# Patient Record
Sex: Male | Born: 1960 | Race: White | Hispanic: No | Smoking: Current every day smoker
Health system: Southern US, Community
[De-identification: ages and names within clinical notes are randomized; demographics above are authoritative.]

## PROBLEM LIST (undated history)

## (undated) DIAGNOSIS — I10 Essential (primary) hypertension: Secondary | ICD-10-CM

## (undated) DIAGNOSIS — R51 Headache: Secondary | ICD-10-CM

## (undated) DIAGNOSIS — M1990 Unspecified osteoarthritis, unspecified site: Secondary | ICD-10-CM

## (undated) DIAGNOSIS — Q288 Other specified congenital malformations of circulatory system: Secondary | ICD-10-CM

## (undated) DIAGNOSIS — E785 Hyperlipidemia, unspecified: Secondary | ICD-10-CM

## (undated) DIAGNOSIS — F419 Anxiety disorder, unspecified: Secondary | ICD-10-CM

## (undated) DIAGNOSIS — R519 Headache, unspecified: Secondary | ICD-10-CM

## (undated) HISTORY — PX: EYE SURGERY: SHX253

## (undated) HISTORY — PX: OTHER SURGICAL HISTORY: SHX169

## (undated) HISTORY — DX: Essential (primary) hypertension: I10

---

## 2008-02-16 ENCOUNTER — Encounter: Payer: Self-pay | Admitting: Family Medicine

## 2008-05-20 ENCOUNTER — Ambulatory Visit: Payer: Self-pay | Admitting: Family Medicine

## 2008-05-20 DIAGNOSIS — I10 Essential (primary) hypertension: Secondary | ICD-10-CM

## 2009-02-03 ENCOUNTER — Ambulatory Visit: Payer: Self-pay | Admitting: Family Medicine

## 2009-02-07 ENCOUNTER — Telehealth: Payer: Self-pay | Admitting: Family Medicine

## 2009-02-27 ENCOUNTER — Encounter: Payer: Self-pay | Admitting: Family Medicine

## 2009-02-27 LAB — CONVERTED CEMR LAB
BUN: 17 mg/dL (ref 6–23)
CO2: 22 meq/L (ref 19–32)
Chloride: 98 meq/L (ref 96–112)
Glucose, Bld: 118 mg/dL — ABNORMAL HIGH (ref 70–99)
Potassium: 4.1 meq/L (ref 3.5–5.3)
Sodium: 135 meq/L (ref 135–145)

## 2009-03-11 ENCOUNTER — Telehealth (INDEPENDENT_AMBULATORY_CARE_PROVIDER_SITE_OTHER): Payer: Self-pay | Admitting: Internal Medicine

## 2009-03-17 ENCOUNTER — Telehealth: Payer: Self-pay | Admitting: Family Medicine

## 2009-03-18 ENCOUNTER — Ambulatory Visit: Payer: Self-pay | Admitting: Family Medicine

## 2009-03-18 DIAGNOSIS — M545 Low back pain: Secondary | ICD-10-CM

## 2009-11-04 ENCOUNTER — Telehealth (INDEPENDENT_AMBULATORY_CARE_PROVIDER_SITE_OTHER): Payer: Self-pay | Admitting: *Deleted

## 2009-11-05 ENCOUNTER — Ambulatory Visit: Payer: Self-pay | Admitting: Family Medicine

## 2010-11-03 ENCOUNTER — Ambulatory Visit: Payer: Self-pay | Admitting: Family Medicine

## 2010-11-03 LAB — CONVERTED CEMR LAB
BUN: 12 mg/dL (ref 6–23)
CO2: 29 meq/L (ref 19–32)
Calcium: 10.2 mg/dL (ref 8.4–10.5)
Creatinine, Ser: 0.9 mg/dL (ref 0.40–1.50)
Glucose, Bld: 95 mg/dL (ref 70–99)
Sodium: 133 meq/L — ABNORMAL LOW (ref 135–145)

## 2011-01-12 NOTE — Assessment & Plan Note (Signed)
Summary: f/u BP   Vital Signs:  Patient profile:   50 year old male Height:      64.2 inches Weight:      145 pounds Pulse rate:   95 / minute BP sitting:   130 / 93  (right arm) Cuff size:   regular  Vitals Entered By: Avon Gully CMA, Duncan Dull) (November 03, 2010 9:19 AM) CC: f/u BP out of meds, Hypertension Management   Primary Care Provider:  Nani Gasser MD  CC:  f/u BP out of meds and Hypertension Management.  History of Present Illness: f/u BP out of meds  Hypertension History:      He denies headache, chest pain, palpitations, dyspnea with exertion, orthopnea, PND, peripheral edema, visual symptoms, neurologic problems, syncope, and side effects from treatment.  He notes no problems with any antihypertensive medication side effects.        Positive major cardiovascular risk factors include male age 36 years old or older, hypertension, and current tobacco user.  Negative major cardiovascular risk factors include negative family history for ischemic heart disease.     Current Medications (verified): 1)  Lisinopril 10 Mg Tabs (Lisinopril) .... Take 1 Tablet By Mouth Once A Day 2)  Hydrochlorothiazide 25 Mg Tabs (Hydrochlorothiazide) .... Take 1 Tablet By Mouth Once A Day  Allergies (verified): No Known Drug Allergies  Comments:  Nurse/Medical Assistant: The patient's medications and allergies were reviewed with the patient and were updated in the Medication and Allergy Lists. 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 GED.  Married to Eustace Hur with a 64 yr old daughter.  Current Smoker Alcohol use-yes Drug use-no Regular exercise-no  Physical Exam  General:  Well-developed,well-nourished,in no acute distress; alert,appropriate and cooperative throughout examination Head:  Normocephalic and atraumatic without obvious  abnormalities. No apparent alopecia or balding. Lungs:  Normal respiratory effort, chest expands symmetrically. Lungs are clear to auscultation, no crackles or wheezes. Heart:  Normal rate and regular rhythm. S1 and S2 normal without gallop, murmur, click, rub or other extra sounds. Skin:  no rashes.   Psych:  Cognition and judgment appear intact. Alert and cooperative with normal attention span and concentration. No apparent delusions, illusions, hallucinations   Impression & Recommendations:  Problem # 1:  HYPERTENSION, BENIGN (ICD-401.1) Restart meds.  His updated medication list for this problem includes:    Lisinopril 10 Mg Tabs (Lisinopril) .Marland Kitchen... Take 1 tablet by mouth once a day    Hydrochlorothiazide 25 Mg Tabs (Hydrochlorothiazide) .Marland Kitchen... Take 1 tablet by mouth once a day  Orders: T-Basic Metabolic Panel [id number])  BP today: 130/93 Prior BP: 130/86 (11/05/2009)  Prior 10 Yr Risk Heart Disease: Not enough information (02/03/2009)  Labs Reviewed: K+: 4.1 (02/27/2009) Creat: : 0.87 (02/27/2009)     Complete Medication List: 1)  Lisinopril 10 Mg Tabs (Lisinopril) .... Take 1 tablet by mouth once a day 2)  Hydrochlorothiazide 25 Mg Tabs (Hydrochlorothiazide) .... Take 1 tablet by mouth once a day  Hypertension Assessment/Plan:      The patient's hypertensive risk group is category B: At least one risk factor (excluding diabetes) with no target organ damage.  Today's blood pressure is 130/93.  His blood pressure goal is < 140/90.  Patient Instructions: 1)  Follow up in one year for blood pressure.  2)  Try to get your cholesterol checked sometimes this year.  Prescriptions: HYDROCHLOROTHIAZIDE 25 MG TABS (HYDROCHLOROTHIAZIDE) Take 1 tablet by mouth once a day  #90 x 3   Entered and Authorized by:   Nani Gasser MD   Signed by:   Nani Gasser MD on 11/03/2010   Method used:   Electronically to        [facility] [phone]* (retail)       [address]       [place], [place]  [address]       Ph: [phone]       Fax: [phone]   RxID:   [id number] LISINOPRIL 10 MG TABS (LISINOPRIL) Take 1 tablet by mouth once a day  #90 x 3   Entered and Authorized by:   Nani Gasser MD   Signed by:   Nani Gasser MD on 11/03/2010   Method used:   Electronically to        [facility] [phone]* (retail)       [address]       [place], [place]  [address]       Ph: [phone]       Fax: [phone]   RxID:   [id number]    Orders Added: 1)  T-Basic Metabolic Panel [[id number]] 2)  Est. Patient Level II [35573]

## 2011-04-26 ENCOUNTER — Ambulatory Visit (INDEPENDENT_AMBULATORY_CARE_PROVIDER_SITE_OTHER): Payer: Self-pay | Admitting: Family Medicine

## 2011-04-26 DIAGNOSIS — IMO0002 Reserved for concepts with insufficient information to code with codable children: Secondary | ICD-10-CM

## 2011-04-26 MED ORDER — SULFAMETHOXAZOLE-TMP DS 800-160 MG PO TABS: 1.0000 | ORAL_TABLET | Freq: Two times a day (BID) | ORAL | Status: AC

## 2011-04-26 NOTE — Patient Instructions (Addendum)
Call if not completely in 10 days.

## 2011-04-26 NOTE — Progress Notes (Signed)
  Subjective:    Patient ID: Gerald Rogers, male    DOB: Dec 06, 1961, 50 y.o.   MRN: [id number]  HPI On the right hand his third digit he has some erythema around the base of the nail. He said that initially he took some amoxicillin that he had to try get better and it actually did improve. Now is starting to get tender and sore again. He has not seen any drainage or pus. He said he did notice some blood one day. He says he accidentally dictating it and it's very tender. No fever. No joint pain.   Review of Systems     Objective:   Physical Exam  Skin:       Digit on the right hand with some mild erythema at the base of the nail. No sign of actual abscess or drainage. He is tender to touch.          Assessment & Plan:   paronychia-discussed treatment options. At this point he certainly has inflammation but I do not think he would actually have any drainage from it. I decided to go ahead and put him on an antibiotic that will cover MRSA. It is not a significant improvement in 5 days please return and we will see if he would benefit from incision and drainage.

## 2011-11-01 ENCOUNTER — Other Ambulatory Visit: Payer: Self-pay | Admitting: *Deleted

## 2011-11-01 MED ORDER — HYDROCHLOROTHIAZIDE 25 MG PO TABS: 25.0000 mg | ORAL_TABLET | Freq: Every day | ORAL | Status: DC

## 2011-11-01 MED ORDER — LISINOPRIL 10 MG PO TABS: 10.0000 mg | ORAL_TABLET | Freq: Every day | ORAL | Status: DC

## 2011-11-12 ENCOUNTER — Encounter: Payer: Self-pay | Admitting: Family Medicine

## 2011-11-18 ENCOUNTER — Ambulatory Visit
Admission: RE | Admit: 2011-11-18 | Discharge: 2011-11-18 | Disposition: A | Discharge status: Home / Self Care | Payer: Self-pay | Source: Ambulatory Visit | Attending: Family Medicine | Admitting: Family Medicine

## 2011-11-18 ENCOUNTER — Encounter: Payer: Self-pay | Admitting: Family Medicine

## 2011-11-18 ENCOUNTER — Ambulatory Visit (INDEPENDENT_AMBULATORY_CARE_PROVIDER_SITE_OTHER): Payer: Self-pay | Admitting: Family Medicine

## 2011-11-18 DIAGNOSIS — R079 Chest pain, unspecified: Secondary | ICD-10-CM

## 2011-11-18 MED ORDER — LISINOPRIL 10 MG PO TABS: 10.0000 mg | ORAL_TABLET | Freq: Every day | ORAL | Status: DC

## 2011-11-18 MED ORDER — HYDROCHLOROTHIAZIDE 25 MG PO TABS: 25.0000 mg | ORAL_TABLET | Freq: Every day | ORAL | Status: DC
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
## 2011-11-18 NOTE — Patient Instructions (Signed)
Recommend daily ASA 81 mg daily.   Keep taking blood pressure medication.  We will call you with your lab and xray results. If you don't here from [place] in about a week then please give [place] a call at [phone].

## 2011-11-19 ENCOUNTER — Encounter: Payer: Self-pay | Admitting: Family Medicine

## 2011-11-19 LAB — LIPID PANEL
HDL: 35 mg/dL — ABNORMAL LOW (ref >39)
LDL Cholesterol: 111 mg/dL — ABNORMAL HIGH (ref 0–99)
Total CHOL/HDL Ratio: 4.9 Ratio
Triglycerides: 136 mg/dL (ref <150)

## 2011-11-19 LAB — COMPLETE METABOLIC PANEL WITH GFR
ALT: 15 U/L (ref 0–53)
Alkaline Phosphatase: 63 U/L (ref 39–117)
CO2: 30 mEq/L (ref 19–32)
Creat: 0.81 mg/dL (ref 0.50–1.35)
GFR, Est African American: >89 mL/min
Sodium: 133 mEq/L — ABNORMAL LOW (ref 135–145)
Total Bilirubin: 0.4 mg/dL (ref 0.3–1.2)
Total Protein: 7.1 g/dL (ref 6.0–8.3)

## 2011-11-19 LAB — TROPONIN I: Troponin I: <0.01 ng/mL (ref <0.06)

## 2011-11-26 ENCOUNTER — Telehealth: Payer: Self-pay | Admitting: Family Medicine

## 2011-11-26 NOTE — Telephone Encounter (Signed)
Patient request to get a call back about meds he has been taking Lisinopril. He thinks he may need a medicine change

## 2011-11-26 NOTE — Telephone Encounter (Signed)
Called and no answer left message to call back

## 2011-12-02 ENCOUNTER — Telehealth: Payer: Self-pay | Admitting: *Deleted

## 2011-12-02 MED ORDER — METOPROLOL SUCCINATE ER 25 MG PO TB24: 25.0000 mg | ORAL_TABLET | Freq: Every day | ORAL | Status: DC

## 2011-12-02 NOTE — Telephone Encounter (Signed)
New prescription sent to pharmacy. Continue HCTZ but stop the lisinopril and start a new medication called metoprolol. Make sure he has a followup appointment.

## 2011-12-02 NOTE — Telephone Encounter (Signed)
Pt called and states he feels his chest pain may be coming from the Lisinopril and wants to know if there are any other meds that can be rx'ed. Also wants to know what he can take for pain since he cannot take advil

## 2011-12-03 NOTE — Telephone Encounter (Signed)
Left message on pt.'s vm.

## 2012-02-04 ENCOUNTER — Other Ambulatory Visit: Payer: Self-pay | Admitting: *Deleted

## 2012-02-04 MED ORDER — METOPROLOL SUCCINATE ER 25 MG PO TB24: 25.0000 mg | ORAL_TABLET | Freq: Every day | ORAL | Status: DC

## 2012-02-04 MED ORDER — HYDROCHLOROTHIAZIDE 25 MG PO TABS: 25.0000 mg | ORAL_TABLET | Freq: Every day | ORAL | Status: DC

## 2012-02-21 ENCOUNTER — Other Ambulatory Visit: Payer: Self-pay | Admitting: *Deleted

## 2012-02-21 MED ORDER — METOPROLOL SUCCINATE ER 25 MG PO TB24: 25.0000 mg | ORAL_TABLET | Freq: Every day | ORAL | Status: DC

## 2012-06-01 ENCOUNTER — Encounter: Payer: Self-pay | Admitting: *Deleted

## 2012-06-09 ENCOUNTER — Ambulatory Visit (INDEPENDENT_AMBULATORY_CARE_PROVIDER_SITE_OTHER): Payer: Self-pay | Admitting: Physician Assistant

## 2012-06-09 ENCOUNTER — Encounter: Payer: Self-pay | Admitting: Physician Assistant

## 2012-06-09 VITALS — BP 115/79 | HR 83 | Ht 64.0 in | Wt 131.0 lb

## 2012-06-09 DIAGNOSIS — Z720 Tobacco use: Secondary | ICD-10-CM

## 2012-06-09 DIAGNOSIS — F17200 Nicotine dependence, unspecified, uncomplicated: Secondary | ICD-10-CM

## 2012-06-09 DIAGNOSIS — IMO0001 Reserved for inherently not codable concepts without codable children: Secondary | ICD-10-CM

## 2012-06-09 DIAGNOSIS — I10 Essential (primary) hypertension: Secondary | ICD-10-CM

## 2012-06-09 DIAGNOSIS — Z79899 Other long term (current) drug therapy: Secondary | ICD-10-CM

## 2012-06-09 MED ORDER — BUPROPION HCL ER (SR) 150 MG PO TB12: 150.0000 mg | ORAL_TABLET | Freq: Two times a day (BID) | ORAL | Status: DC

## 2012-06-09 MED ORDER — HYDROCHLOROTHIAZIDE 25 MG PO TABS: 25.0000 mg | ORAL_TABLET | Freq: Every day | ORAL | Status: DC

## 2012-06-09 MED ORDER — METOPROLOL SUCCINATE ER 25 MG PO TB24: 25.0000 mg | ORAL_TABLET | Freq: Every day | ORAL | Status: DC

## 2012-06-09 NOTE — Patient Instructions (Addendum)
Refilled blood pressure medications. Gave Wellbutrin 150mg  tab twice a day to help stop smoking. Set quit date 1 week after starting pills. Follow up in 6 months.  Will give stool cards to bring back to office.  Smoking Cessation This document explains the best ways for you to quit smoking and new treatments to help. It lists new medicines that can double or triple your chances of quitting and quitting for good. It also considers ways to avoid relapses and concerns you may have about quitting, including weight gain. NICOTINE: A POWERFUL ADDICTION If you have tried to quit smoking, you know how hard it can be. It is hard because nicotine is a very addictive drug. For some people, it can be as addictive as heroin or cocaine. Usually, people make 2 or 3 tries, or more, before finally being able to quit. Each time you try to quit, you can learn about what helps and what hurts. Quitting takes hard work and a lot of effort, but you can quit smoking. QUITTING SMOKING IS ONE OF THE MOST IMPORTANT THINGS YOU WILL EVER DO.  You will live longer, feel better, and live better.   The impact on your body of quitting smoking is felt almost immediately:   Within 20 minutes, blood pressure decreases. Pulse returns to its normal level.   After 8 hours, carbon monoxide levels in the blood return to normal. Oxygen level increases.   After 24 hours, chance of heart attack starts to decrease. Breath, hair, and body stop smelling like smoke.   After 48 hours, damaged nerve endings begin to recover. Sense of taste and smell improve.   After 72 hours, the body is virtually free of nicotine. Bronchial tubes relax and breathing becomes easier.   After 2 to 12 weeks, lungs can hold more air. Exercise becomes easier and circulation improves.   Quitting will reduce your risk of having a heart attack, stroke, cancer, or lung disease:   After 1 year, the risk of coronary heart disease is cut in half.   After 5 years,  the risk of stroke falls to the same as a nonsmoker.   After 10 years, the risk of lung cancer is cut in half and the risk of other cancers decreases significantly.   After 15 years, the risk of coronary heart disease drops, usually to the level of a nonsmoker.   If you are pregnant, quitting smoking will improve your chances of having a healthy baby.   The people you live with, especially your children, will be healthier.   You will have extra money to spend on things other than cigarettes.  FIVE KEYS TO QUITTING Studies have shown that these 5 steps will help you quit smoking and quit for good. You have the best chances of quitting if you use them together: 1. Get ready.  2. Get support and encouragement.  3. Learn new skills and behaviors.  4. Get medicine to reduce your nicotine addiction and use it correctly.  5. Be prepared for relapse or difficult situations. Be determined to continue trying to quit, even if you do not succeed at first.  1. GET READY  Set a quit date.   Change your environment.   Get rid of ALL cigarettes, ashtrays, matches, and lighters in your home, car, and place of work.   Do not let people smoke in your home.   Review your past attempts to quit. Think about what worked and what did not.   Once you  quit, do not smoke. NOT EVEN A PUFF!  2. GET SUPPORT AND ENCOURAGEMENT Studies have shown that you have a better chance of being successful if you have help. You can get support in many ways.  Tell your family, friends, and coworkers that you are going to quit and need their support. Ask them not to smoke around you.   Talk to your caregivers (doctor, dentist, nurse, pharmacist, psychologist, and/or smoking counselor).   Get individual, group, or telephone counseling and support. The more counseling you have, the better your chances are of quitting. Programs are available at [employer] and health centers. Call your local health department for  information about programs in your area.   Spiritual beliefs and practices may help some smokers quit.   Quit meters are Photographer that keep track of quit statistics, such as amount of "quit-time," cigarettes not smoked, and money saved.   Many smokers find one or more of the many self-help books available useful in helping them quit and stay off tobacco.  3. LEARN NEW SKILLS AND BEHAVIORS  Try to distract yourself from urges to smoke. Talk to someone, go for a walk, or occupy your time with a task.   When you first try to quit, change your routine. Take a different route to work. Drink tea instead of coffee. Eat breakfast in a different place.   Do something to reduce your stress. Take a hot bath, exercise, or read a book.   Plan something enjoyable to do every day. Reward yourself for not smoking.   Explore interactive web-based programs that specialize in helping you quit.  4. GET MEDICINE AND USE IT CORRECTLY Medicines can help you stop smoking and decrease the urge to smoke. Combining medicine with the above behavioral methods and support can quadruple your chances of successfully quitting smoking. The U.S. Food and Drug Administration (FDA) has approved 7 medicines to help you quit smoking. These medicines fall into 3 categories.  Nicotine replacement therapy (delivers nicotine to your body without the negative effects and risks of smoking):   Nicotine gum: Available over-the-counter.   Nicotine lozenges: Available over-the-counter.   Nicotine inhaler: Available by prescription.   Nicotine nasal spray: Available by prescription.   Nicotine skin patches (transdermal): Available by prescription and over-the-counter.   Antidepressant medicine (helps people abstain from smoking, but how this works is unknown):   Bupropion sustained-release (SR) tablets: Available by prescription.   Nicotinic receptor partial agonist (simulates the effect of  nicotine in your brain):   Varenicline tartrate tablets: Available by prescription.   Ask your caregiver for advice about which medicines to use and how to use them. Carefully read the information on the package.   Everyone who is trying to quit may benefit from using a medicine. If you are pregnant or trying to become pregnant, nursing an infant, you are under age 84, or you smoke fewer than 10 cigarettes per day, talk to your caregiver before taking any nicotine replacement medicines.   You should stop using a nicotine replacement product and call your caregiver if you experience nausea, dizziness, weakness, vomiting, fast or irregular heartbeat, mouth problems with the lozenge or gum, or redness or swelling of the skin around the patch that does not go away.   Do not use any other product containing nicotine while using a nicotine replacement product.   Talk to your caregiver before using these products if you have diabetes, heart disease, asthma, stomach ulcers, you  had a recent heart attack, you have high blood pressure that is not controlled with medicine, a history of irregular heartbeat, or you have been prescribed medicine to help you quit smoking.  5. BE PREPARED FOR RELAPSE OR DIFFICULT SITUATIONS  Most relapses occur within the first 3 months after quitting. Do not be discouraged if you start smoking again. Remember, most people try several times before they finally quit.   You may have symptoms of withdrawal because your body is used to nicotine. You may crave cigarettes, be irritable, feel very hungry, cough often, get headaches, or have difficulty concentrating.   The withdrawal symptoms are only temporary. They are strongest when you first quit, but they will go away within 10 to 14 days.  Here are some difficult situations to watch for:  Alcohol. Avoid drinking alcohol. Drinking lowers your chances of successfully quitting.   Caffeine. Try to reduce the amount of caffeine you  consume. It also lowers your chances of successfully quitting.   Other smokers. Being around smoking can make you want to smoke. Avoid smokers.   Weight gain. Many smokers will gain weight when they quit, usually less than 10 pounds. Eat a healthy diet and stay active. Do not let weight gain distract you from your main goal, quitting smoking. Some medicines that help you quit smoking may also help delay weight gain. You can always lose the weight gained after you quit.   Bad mood or depression. There are a lot of ways to improve your mood other than smoking.  If you are having problems with any of these situations, talk to your caregiver. SPECIAL SITUATIONS AND CONDITIONS Studies suggest that everyone can quit smoking. Your situation or condition can give you a special reason to quit.  Pregnant women/new mothers: By quitting, you protect your baby's health and your own.   Hospitalized patients: By quitting, you reduce health problems and help healing.   Heart attack patients: By quitting, you reduce your risk of a second heart attack.   Lung, head, and neck cancer patients: By quitting, you reduce your chance of a second cancer.   Parents of children and adolescents: By quitting, you protect your children from illnesses caused by secondhand smoke.  QUESTIONS TO THINK ABOUT Think about the following questions before you try to stop smoking. You may want to talk about your answers with your caregiver.  Why do you want to quit?   If you tried to quit in the past, what helped and what did not?   What will be the most difficult situations for you after you quit? How will you plan to handle them?   Who can help you through the tough times? Your family? Friends? Caregiver?   What pleasures do you get from smoking? What ways can you still get pleasure if you quit?  Here are some questions to ask your caregiver:  How can you help me to be successful at quitting?   What medicine do you  think would be best for me and how should I take it?   What should I do if I need more help?   What is smoking withdrawal like? How can I get information on withdrawal?  Quitting takes hard work and a lot of effort, but you can quit smoking. FOR MORE INFORMATION  Smokefree.gov (http://www.davis-sullivan.com/) provides free, accurate, evidence-based information and professional assistance to help support the immediate and long-term needs of people trying to quit smoking. Document Released: 11/23/2001 Document Revised: 11/18/2011 Document  Reviewed: 09/15/2009 [facility] Patient Information 2012 [place], [place].

## 2012-06-09 NOTE — Progress Notes (Signed)
  Subjective:    Patient ID: Gerald Rogers, male    DOB: 05-07-1961, 51 y.o.   MRN: [id number]  HPI Patient presents to clinic to follow up on HTN and get medication refills. He also wants to get something to help him quit smoking. He feels great today. Denies HA, SOB, CP, or palpitations. He smokes about a pack a day. He has tried patches and wants to try medication. Had previous labs with low sodium. Needs rechecked today.   Review of Systems     Objective:   Physical Exam  Constitutional: He is oriented to person, place, and time. He appears well-developed and well-nourished.  HENT:  Head: Normocephalic and atraumatic.  Eyes: Conjunctivae are normal.  Cardiovascular: Normal rate, regular rhythm, normal heart sounds and intact distal pulses.   Pulmonary/Chest: Effort normal and breath sounds normal. He has no wheezes.  Neurological: He is alert and oriented to person, place, and time.  Skin: Skin is warm and dry.  Psychiatric: He has a normal mood and affect. His behavior is normal.          Assessment & Plan:  HTN- Meds were refilled today. Pt encouraged to exercise regularly and maintain an healthy diet. Follow up in 6 months.  Low sodium- Will recheck with BMP today.  Tobacco abuse- Gave handout on smoking cessation. Gave rx for wellbutrin to start twice a day along with a quit date 1 week after starting wellbutrin. Gave rx for 3 months. Follow up in 3 months to review smoking status.Side effects were reviewed and pt made aware that if depression occurs or any not normal thoughts to stop medication and call office.   Pt declined colonoscopy due to cost. He was given stool cards to return to clinic.  Pt declined Tdap and Pneumonia vaccine due to cost.

## 2012-06-12 NOTE — Progress Notes (Signed)
This encounter was created in error - please disregard.

## 2012-06-13 ENCOUNTER — Other Ambulatory Visit: Payer: Self-pay | Admitting: Family Medicine

## 2012-06-26 ENCOUNTER — Telehealth: Payer: Self-pay | Admitting: Family Medicine

## 2012-06-26 NOTE — Telephone Encounter (Signed)
All meds were refilled on 6/28 so should have enough for 10 more days.  I would wait closer adn see if come in before have to pay for more. As far as the fax is concerned I have no idea when forms were completed, signed and faxed.

## 2012-06-26 NOTE — Telephone Encounter (Signed)
Patient's wife called advised she called last week about pt's prescriptions being faxed over to [facility] and they just received fax Friday 06/23/12 and its going to take 5-10 business days and the pt only has 5 pills left. Patient's wife request to know if 10 pills can be [facility] until the pt receive's his mail in order of meds. She request a call back [phone] his wife wanted me to let you know she is not happy that she was told fax had been sent but the company is just getting fax Friday.

## 2012-06-27 ENCOUNTER — Telehealth: Payer: Self-pay | Admitting: *Deleted

## 2012-06-27 NOTE — Telephone Encounter (Signed)
See previous phone note. Wife notified and agreed to wait closer to see if meds will come in

## 2012-06-27 NOTE — Telephone Encounter (Signed)
LM for pt's wife to return call

## 2012-06-28 ENCOUNTER — Other Ambulatory Visit: Payer: Self-pay | Admitting: *Deleted

## 2012-06-28 MED ORDER — METOPROLOL SUCCINATE ER 25 MG PO TB24: 25.0000 mg | ORAL_TABLET | Freq: Every day | ORAL | Status: DC

## 2012-06-28 NOTE — Telephone Encounter (Signed)
LM at home number to return call.

## 2012-06-29 NOTE — Telephone Encounter (Signed)
Unable to contact pt with multiple attempts. Letter mailed to contact us.

## 2012-06-30 ENCOUNTER — Other Ambulatory Visit: Payer: Self-pay | Admitting: Family Medicine

## 2012-06-30 ENCOUNTER — Other Ambulatory Visit: Payer: Self-pay | Admitting: *Deleted

## 2012-06-30 MED ORDER — METOPROLOL SUCCINATE ER 25 MG PO TB24: 25.0000 mg | ORAL_TABLET | Freq: Every day | ORAL | Status: DC

## 2012-07-03 ENCOUNTER — Ambulatory Visit (INDEPENDENT_AMBULATORY_CARE_PROVIDER_SITE_OTHER): Payer: Self-pay | Admitting: Family Medicine

## 2012-07-03 ENCOUNTER — Encounter: Payer: Self-pay | Admitting: *Deleted

## 2012-07-03 ENCOUNTER — Encounter: Payer: Self-pay | Admitting: Family Medicine

## 2012-07-03 VITALS — BP 128/84 | HR 60 | Wt 130.0 lb

## 2012-07-03 DIAGNOSIS — R42 Dizziness and giddiness: Secondary | ICD-10-CM

## 2012-07-03 DIAGNOSIS — R51 Headache: Secondary | ICD-10-CM

## 2012-07-03 DIAGNOSIS — F17200 Nicotine dependence, unspecified, uncomplicated: Secondary | ICD-10-CM

## 2012-07-03 DIAGNOSIS — E871 Hypo-osmolality and hyponatremia: Secondary | ICD-10-CM

## 2012-07-03 DIAGNOSIS — E876 Hypokalemia: Secondary | ICD-10-CM

## 2012-07-03 NOTE — Patient Instructions (Addendum)
Stop the metoprolol. We will call you with your lab results. If you don't here from [place] in about a week then please give [place] a call at [phone]. Increase your aspirin to 325mg  daily.

## 2012-07-03 NOTE — Progress Notes (Signed)
Subjective:    Patient ID: Gerald Rogers, male    DOB: 1961-09-28, 51 y.o.   MRN: [id number]  HPI Went to [facility] ED bc of dizziness and slurred speech. Also experienced some CP at the time but feels it was anxiety related.  Had CT of Head.  No acute abnormality. 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 Normal CXR. Neg cardiac enzymes.  Sodium and potassium was low and WBC ct elevated to 13. Told not acute infarct.  Next day had a similar episdoes. Then 2 smaller episodes later in the day.  Says feels dizzy when goes around a corner.  Also feels SOB with it and heavy in the chest. Also notices some trouble swallowing.  Has been taking an ASA 81mg .  Given Ativan in the ED.  + smoker. Also c/o of recent frequent HA.   Lab Results  Component Value Date   CHOL 173 11/18/2011   HDL 35* 11/18/2011   LDLCALC 111* 11/18/2011   TRIG 136 11/18/2011   CHOLHDL 4.9 11/18/2011      Review of Systems     Objective:   Physical Exam  Constitutional: He is oriented to person, place, and time. He appears well-developed and well-nourished.  HENT:  Head: Normocephalic and atraumatic.  Right Ear: External ear normal.  Left Ear: External ear normal.  Nose: Nose normal.  Mouth/Throat: Oropharynx is clear and moist.       TMs and canals are clear.   Eyes: Conjunctivae and EOM are normal. Pupils are equal, round, and reactive to light.  Neck: Neck supple. No thyromegaly present.  Cardiovascular: Normal rate, regular rhythm and normal heart sounds.   Pulmonary/Chest: Effort normal and breath sounds normal.  Musculoskeletal:       Reflexes 2+ in UE and LE.   Lymphadenopathy:    He has no cervical adenopathy.  Neurological: He is alert and oriented to person, place, and time. No cranial nerve deficit.       + diks- hallpike.   Skin: Skin is warm and dry.  Psychiatric: He has a normal mood and affect. His behavior is normal.          Assessment & Plan:  Dizziness - Based on exam I do think he has BPV  with a positive Dix-Hallpike maneuver. He'll given handout on exercises to do at home. I would also like to get a d-dimer did not see this on his blood work is certainly a PE could be the cause of his chest pain, shortness of breath and dizziness. He did have a normal chest x-ray which is reassuring. I also encouraged him to increase his aspirin from 81 mg a day to 325mg  a day.   Bradycardia- he's been tracking his heart rate home and has had several heart rate under 60. I wonder if he could be having bradycardia that could be contributing to his dizziness and headaches. I will stop his metoprolol for now. His blood pressure looks great.   hyponatremia and hypokalemia recheck today. I would also like him to cut his Zocor thiazide in half at least we get his results back. He may be a get better candidate for combination ACE inhibitor with a lower dose of HCTZ.  Elevated white count recheck today. Blood cultures were performed we will need to call the hospital to see if these have been resulted yet.  Tobacco abuse-we also discussed the need for smoking cessation as this certainly increases risk of heart attack and stroke. He says he  is aware is not quite ready to quit.

## 2012-07-04 LAB — CBC WITH DIFFERENTIAL/PLATELET
Basophils Absolute: 0 10*3/uL (ref 0.0–0.1)
Basophils Relative: 0 % (ref 0–1)
Eosinophils Relative: 2 % (ref 0–5)
HCT: 45.3 % (ref 39.0–52.0)
MCH: 30.7 pg (ref 26.0–34.0)
MCHC: 34.7 g/dL (ref 30.0–36.0)
MCV: 88.5 fL (ref 78.0–100.0)
Monocytes Absolute: 0.9 10*3/uL (ref 0.1–1.0)
RDW: 16.2 % — ABNORMAL HIGH (ref 11.5–15.5)

## 2012-07-04 LAB — BASIC METABOLIC PANEL WITH GFR
BUN: 13 mg/dL (ref 6–23)
Creat: 0.89 mg/dL (ref 0.50–1.35)
GFR, Est African American: >89 mL/min
GFR, Est Non African American: >89 mL/min

## 2012-07-05 ENCOUNTER — Telehealth: Payer: Self-pay | Admitting: *Deleted

## 2012-07-05 DIAGNOSIS — Z9889 Other specified postprocedural states: Secondary | ICD-10-CM

## 2012-07-05 DIAGNOSIS — R4781 Slurred speech: Secondary | ICD-10-CM

## 2012-07-05 DIAGNOSIS — R42 Dizziness and giddiness: Secondary | ICD-10-CM

## 2012-07-05 NOTE — Telephone Encounter (Signed)
Pt bent over to put shoes on this morning and tweeked his back some. Then when got in car he felt really dizzy so did not go to work. Now still feels shaky and light headed. Did take some Advil for his back and this helped the back. Questions what he needs to do- ? Come in, stay home or get MRI of head. Had shaking episode again yesterday that lasted about 1 hour.

## 2012-07-05 NOTE — Telephone Encounter (Signed)
Can he come by for BP check today?

## 2012-07-05 NOTE — Telephone Encounter (Signed)
Pt notified and he does not have a way to check BP at home.

## 2012-07-05 NOTE — Telephone Encounter (Signed)
No he is alone at home and does not feel comfortable driving

## 2012-07-05 NOTE — Telephone Encounter (Signed)
Lets gets MRI.  Ordered place but have Gerald Rogers go ahead and schedule.  Can he also check his BP at home?

## 2012-07-08 ENCOUNTER — Ambulatory Visit (HOSPITAL_BASED_OUTPATIENT_CLINIC_OR_DEPARTMENT_OTHER)
Admission: RE | Admit: 2012-07-08 | Discharge: 2012-07-08 | Disposition: A | Discharge status: Home / Self Care | Payer: Worker's Compensation | Source: Ambulatory Visit | Attending: Family Medicine | Admitting: Family Medicine

## 2012-07-08 DIAGNOSIS — R51 Headache: Secondary | ICD-10-CM | POA: Insufficient documentation

## 2012-07-08 DIAGNOSIS — E871 Hypo-osmolality and hyponatremia: Secondary | ICD-10-CM | POA: Insufficient documentation

## 2012-07-08 DIAGNOSIS — R42 Dizziness and giddiness: Secondary | ICD-10-CM | POA: Insufficient documentation

## 2012-07-08 DIAGNOSIS — F17200 Nicotine dependence, unspecified, uncomplicated: Secondary | ICD-10-CM | POA: Insufficient documentation

## 2012-07-10 ENCOUNTER — Other Ambulatory Visit: Payer: Self-pay | Admitting: Family Medicine

## 2012-07-10 ENCOUNTER — Telehealth: Payer: Self-pay | Admitting: *Deleted

## 2012-07-10 DIAGNOSIS — R410 Disorientation, unspecified: Secondary | ICD-10-CM

## 2012-07-10 DIAGNOSIS — R251 Tremor, unspecified: Secondary | ICD-10-CM

## 2012-07-10 MED ORDER — PRAVASTATIN SODIUM 20 MG PO TABS: 20.0000 mg | ORAL_TABLET | Freq: Every day | ORAL | Status: DC

## 2012-07-10 MED ORDER — AMOXICILLIN-POT CLAVULANATE 875-125 MG PO TABS: 1.0000 | ORAL_TABLET | Freq: Two times a day (BID) | ORAL | Status: AC

## 2012-07-10 NOTE — Telephone Encounter (Signed)
Work note

## 2012-11-30 ENCOUNTER — Ambulatory Visit (INDEPENDENT_AMBULATORY_CARE_PROVIDER_SITE_OTHER): Payer: PRIVATE HEALTH INSURANCE | Admitting: Sports Medicine

## 2012-11-30 ENCOUNTER — Encounter: Payer: Self-pay | Admitting: Sports Medicine

## 2012-11-30 VITALS — BP 144/101 | HR 69 | Wt 150.0 lb

## 2012-11-30 DIAGNOSIS — I10 Essential (primary) hypertension: Secondary | ICD-10-CM

## 2012-11-30 MED ORDER — OLMESARTAN MEDOXOMIL-HCTZ 40-25 MG PO TABS: 1.0000 | ORAL_TABLET | Freq: Every day | ORAL | Status: DC

## 2012-11-30 NOTE — Assessment & Plan Note (Addendum)
Blood pressure was elevated in the 200s systolic another office, is improved here. Unable to afford bloodwork. He is taking hydrochlorothiazide, is not taking metoprolol. I'm going to change his medicines around, he will take Benicar/HCT 40/12.5 mg daily for 2 weeks, and then increase to 40/25 mg daily. I've given him enough samples to last for the entire month. He will come back in 2 weeks to recheck blood pressure.

## 2012-11-30 NOTE — Progress Notes (Signed)
Subjective:    CC: Blood pressure elevated  HPI: Gerald Rogers is a very pleasant 51 year old male with a history of hypertension, who was noted to have an elevated blood pressure in the 200s systolic and a neurologist visit earlier today. He saw the physician assistant, she became very nervous, and advised that he be seen immediately. At that time, he had no headache, visual changes, chest pain, shortness of breath, lower extremity swelling. He returns here, for further evaluation. He only takes hydrochlorothiazide 25 mg daily, and does not use any other blood pressure medicines. He notes that he felt "yucky" on lisinopril and metoprolol. He never had any episodes of swelling, or cough.  He and his wife also note that they are broke, and cannot afford any blood work.  Past medical history, Surgical history, Family history, Social history, Allergies, and medications have been entered into the medical record, reviewed, and no changes needed.   Review of Systems: No fevers, chills, night sweats, weight loss, chest pain, or shortness of breath.   Objective:    General: Well Developed, well nourished, and in no acute distress.  Neuro: Alert and oriented x3, extra-ocular muscles intact.  HEENT: Normocephalic, atraumatic, pupils equal round reactive to light, neck supple, no masses, no lymphadenopathy, thyroid nonpalpable.  Skin: Warm and dry, no rashes. Cardiac: Regular rate and rhythm, no murmurs rubs or gallops.  Respiratory: Clear to auscultation bilaterally. Not using accessory muscles, speaking in full sentences. Lower extremities: Neurovascularly intact, no swelling, no edema.  Impression and Recommendations:

## 2012-12-19 ENCOUNTER — Ambulatory Visit: Payer: Self-pay | Admitting: Family Medicine

## 2012-12-25 ENCOUNTER — Telehealth: Payer: Self-pay | Admitting: *Deleted

## 2012-12-25 MED ORDER — LOSARTAN POTASSIUM-HCTZ 100-25 MG PO TABS: 1.0000 | ORAL_TABLET | Freq: Every day | ORAL | Status: DC

## 2012-12-25 NOTE — Telephone Encounter (Signed)
Pt calls and states at last visit was put on combo Benicar/hctz and was gonna cost over 200.00 dollars for thirty day supply and wants to know if there is something cheaper or generic he can go on.

## 2012-12-25 NOTE — Telephone Encounter (Signed)
Pt notified and med sent 

## 2012-12-25 NOTE — Telephone Encounter (Signed)
Will change to losartan HCT and it will be cheaper.

## 2013-06-28 ENCOUNTER — Ambulatory Visit (INDEPENDENT_AMBULATORY_CARE_PROVIDER_SITE_OTHER): Payer: PRIVATE HEALTH INSURANCE | Admitting: Family Medicine

## 2013-06-28 ENCOUNTER — Encounter: Payer: Self-pay | Admitting: Family Medicine

## 2013-06-28 VITALS — BP 107/70 | HR 69 | Ht 65.0 in | Wt 131.0 lb

## 2013-06-28 DIAGNOSIS — I10 Essential (primary) hypertension: Secondary | ICD-10-CM

## 2013-06-28 DIAGNOSIS — R251 Tremor, unspecified: Secondary | ICD-10-CM | POA: Insufficient documentation

## 2013-06-28 DIAGNOSIS — E871 Hypo-osmolality and hyponatremia: Secondary | ICD-10-CM

## 2013-06-28 DIAGNOSIS — R569 Unspecified convulsions: Secondary | ICD-10-CM | POA: Insufficient documentation

## 2013-06-28 DIAGNOSIS — R319 Hematuria, unspecified: Secondary | ICD-10-CM

## 2013-06-28 DIAGNOSIS — R369 Urethral discharge, unspecified: Secondary | ICD-10-CM

## 2013-06-28 DIAGNOSIS — F0781 Postconcussional syndrome: Secondary | ICD-10-CM

## 2013-06-28 DIAGNOSIS — R259 Unspecified abnormal involuntary movements: Secondary | ICD-10-CM

## 2013-06-28 LAB — POCT URINALYSIS DIPSTICK
Bilirubin, UA: NEGATIVE
Nitrite, UA: NEGATIVE
Spec Grav, UA: 1.015
pH, UA: 6.5

## 2013-06-28 LAB — BASIC METABOLIC PANEL WITH GFR
BUN: 9 mg/dL (ref 6–23)
Chloride: 93 mEq/L — ABNORMAL LOW (ref 96–112)
Creat: 0.91 mg/dL (ref 0.50–1.35)
GFR, Est African American: >89 mL/min
GFR, Est Non African American: >89 mL/min
Glucose, Bld: 90 mg/dL (ref 70–99)

## 2013-06-28 MED ORDER — LOSARTAN POTASSIUM 100 MG PO TABS: 100.0000 mg | ORAL_TABLET | Freq: Every day | ORAL | Status: DC

## 2013-06-28 MED ORDER — PRAVASTATIN SODIUM 20 MG PO TABS: 20.0000 mg | ORAL_TABLET | Freq: Every day | ORAL | Status: DC

## 2013-06-28 NOTE — Addendum Note (Signed)
Addended by: Deno Etienne on: 06/28/2013 03:51 PM   Modules accepted: Orders

## 2013-06-28 NOTE — Addendum Note (Signed)
Addended by: Deno Etienne on: 06/28/2013 05:15 PM   Modules accepted: Orders

## 2013-06-28 NOTE — Addendum Note (Signed)
Addended by: Deno Etienne on: 06/28/2013 04:13 PM   Modules accepted: Orders

## 2013-06-28 NOTE — Progress Notes (Signed)
  Subjective:    Patient ID: Gerald Rogers, male    DOB: [birth year], 52 y.o.   MRN: [id number]  HPI Recently hospitalized after LOC and noted the sodium was low 126. Saw neurology about a week. They added Keppra.   HTN-  Pt denies chest pain, SOB, dizziness, or heart palpitations.  Taking meds as directed w/o problems.  Denies medication side effects.    Penile discharge for couple of weeks. He says it almost is like he is leaking but says it does not smell like urine. He denies any dysuria or discomfort. He denies any hematuria. He denies any recent fevers or pelvic pain. He has not been sexually active in quite a long time. And was trying skin thickening of the  Review of Systems No chills m fevers or sweat. + fatigue    Objective:   Physical Exam  Constitutional: He is oriented to person, place, and time. He appears well-developed and well-nourished.  HENT:  Head: Normocephalic and atraumatic.  Cardiovascular: Normal rate, regular rhythm and normal heart sounds.   Pulmonary/Chest: Effort normal and breath sounds normal.  Neurological: He is alert and oriented to person, place, and time.  Skin: Skin is warm and dry.  Psychiatric: He has a normal mood and affect. His behavior is normal.          Assessment & Plan:  HTN - well controlled. Certainly his medication could be contributing to the hyponatremia so we'll make adjustments today and then recheck his sodium in one week. Stop hydrochlorothiazide. We'll treat with losartan by itself.  Hyponatremia-this could be caused by his blood pressure medication. We will make adjustments and recheck in one week. Outlet also recheck the sodium today as a baseline to see if this release is improved from his hospitalization. He thinks he had a chest x-ray when he was there. Also consider further workup of his chest or rule out a tumor since he is a smoker.  Penile discharge-collected for GC Chlamydia swab today. Patient tolerated the  procedure well. Will check urinalysis as well. As the leaking sounds are most more like urine.  Tobacco abuse-encourage cessation.

## 2013-06-28 NOTE — Patient Instructions (Addendum)
Repeat sodium level in one week Stop the losartan HCTZ and take the plain losartan.    Smoking Cessation, Tips for Success YOU CAN QUIT SMOKING If you are ready to quit smoking, congratulations! You have chosen to help yourself be healthier. Cigarettes bring nicotine, tar, carbon monoxide, and other irritants into your body. Your lungs, heart, and blood vessels will be able to work better without these poisons. There are many different ways to quit smoking. Nicotine gum, nicotine patches, a nicotine inhaler, or nicotine nasal spray can help with physical craving. Hypnosis, support groups, and medicines help break the habit of smoking. Here are some tips to help you quit for good.  Throw away all cigarettes.  Clean and remove all ashtrays from your home, work, and car.  On a card, write down your reasons for quitting. Carry the card with you and read it when you get the urge to smoke.  Cleanse your body of nicotine. Drink enough water and fluids to keep your urine clear or pale yellow. Do this after quitting to flush the nicotine from your body.  Learn to predict your moods. Do not let a bad situation be your excuse to have a cigarette. Some situations in your life might tempt you into wanting a cigarette.  Never have "just one" cigarette. It leads to wanting another and another. Remind yourself of your decision to quit.  Change habits associated with smoking. If you smoked while driving or when feeling stressed, try other activities to replace smoking. Stand up when drinking your coffee. Brush your teeth after eating. Sit in a different chair when you read the paper. Avoid alcohol while trying to quit, and try to drink fewer caffeinated beverages. Alcohol and caffeine may urge you to smoke.  Avoid foods and drinks that can trigger a desire to smoke, such as sugary or spicy foods and alcohol.  Ask people who smoke not to smoke around you.  Have something planned to do right after eating or  having a cup of coffee. Take a walk or exercise to perk you up. This will help to keep you from overeating.  Try a relaxation exercise to calm you down and decrease your stress. Remember, you may be tense and nervous for the first 2 weeks after you quit, but this will pass.  Find new activities to keep your hands busy. Play with a pen, coin, or rubber band. Doodle or draw things on paper.  Brush your teeth right after eating. This will help cut down on the craving for the taste of tobacco after meals. You can try mouthwash, too.  Use oral substitutes, such as lemon drops, carrots, a cinnamon stick, or chewing gum, in place of cigarettes. Keep them handy so they are available when you have the urge to smoke.  When you have the urge to smoke, try deep breathing.  Designate your home as a nonsmoking area.  If you are a heavy smoker, ask your caregiver about a prescription for nicotine chewing gum. It can ease your withdrawal from nicotine.  Reward yourself. Set aside the cigarette money you save and buy yourself something nice.  Look for support from others. Join a support group or smoking cessation program. Ask someone at home or at work to help you with your plan to quit smoking.  Always ask yourself, "Do I need this cigarette or is this just a reflex?" Tell yourself, "Today, I choose not to smoke," or "I do not want to smoke." You are reminding  yourself of your decision to quit, even if you do smoke a cigarette. HOW WILL I FEEL WHEN I QUIT SMOKING?  The benefits of not smoking start within days of quitting.  You may have symptoms of withdrawal because your body is used to nicotine (the addictive substance in cigarettes). You may crave cigarettes, be irritable, feel very hungry, cough often, get headaches, or have difficulty concentrating.  The withdrawal symptoms are only temporary. They are strongest when you first quit but will go away within 10 to 14 days.  When withdrawal symptoms  occur, stay in control. Think about your reasons for quitting. Remind yourself that these are signs that your body is healing and getting used to being without cigarettes.  Remember that withdrawal symptoms are easier to treat than the major diseases that smoking can cause.  Even after the withdrawal is over, expect periodic urges to smoke. However, these cravings are generally short-lived and will go away whether you smoke or not. Do not smoke!  If you relapse and smoke again, do not lose hope. Most smokers quit 3 times before they are successful.  If you relapse, do not give up! Plan ahead and think about what you will do the next time you get the urge to smoke. LIFE AS A NONSMOKER: MAKE IT FOR A MONTH, MAKE IT FOR LIFE Day 1: Hang this page where you will see it every day. Day 2: Get rid of all ashtrays, matches, and lighters. Day 3: Drink water. Breathe deeply between sips. Day 4: Avoid places with smoke-filled air, such as bars, clubs, or the smoking section of restaurants. Day 5: Keep track of how much money you save by not smoking. Day 6: Avoid boredom. Keep a good book with you or go to the movies. Day 7: Reward yourself! One week without smoking! Day 8: Make a dental appointment to get your teeth cleaned. Day 9: Decide how you will turn down a cigarette before it is offered to you. Day 10: Review your reasons for quitting. Day 11: Distract yourself. Stay active to keep your mind off smoking and to relieve tension. Take a walk, exercise, read a book, do a crossword puzzle, or try a new hobby. Day 12: Exercise. Get off the bus before your stop or use stairs instead of escalators. Day 13: Call on friends for support and encouragement. Day 14: Reward yourself! Two weeks without smoking! Day 15: Practice deep breathing exercises. Day 16: Bet a friend that you can stay a nonsmoker. Day 17: Ask to sit in nonsmoking sections of restaurants. Day 18: Hang up "No Smoking" signs. Day 19:  Think of yourself as a nonsmoker. Day 20: Each morning, tell yourself you will not smoke. Day 21: Reward yourself! Three weeks without smoking! Day 22: Think of smoking in negative ways. Remember how it stains your teeth, gives you bad breath, and leaves you short of breath. Day 23: Eat a nutritious breakfast. Day 24:Do not relive your days as a smoker. Day 25: Hold a pencil in your hand when talking on the telephone. Day 26: Tell all your friends you do not smoke. Day 27: Think about how much better food tastes. Day 28: Remember, one cigarette is one too many. Day 29: Take up a hobby that will keep your hands busy. Day 30: Congratulations! One month without smoking! Give yourself a big reward. Your caregiver can direct you to community resources or hospitals for support, which may include:  Group support.  Education.  Hypnosis.  Subliminal therapy. Document Released: 08/27/2004 Document Revised: 02/21/2012 Document Reviewed: 09/15/2009 [facility] Patient Information 2014 [place], [place].

## 2013-06-29 ENCOUNTER — Other Ambulatory Visit: Payer: Self-pay | Admitting: *Deleted

## 2013-06-29 DIAGNOSIS — E871 Hypo-osmolality and hyponatremia: Secondary | ICD-10-CM

## 2013-06-29 LAB — GC/CHLAMYDIA PROBE AMP: CT Probe RNA: NEGATIVE

## 2013-07-02 ENCOUNTER — Other Ambulatory Visit: Payer: Self-pay | Admitting: *Deleted

## 2013-07-02 DIAGNOSIS — E871 Hypo-osmolality and hyponatremia: Secondary | ICD-10-CM

## 2013-07-05 ENCOUNTER — Ambulatory Visit: Payer: Self-pay | Admitting: Family Medicine

## 2013-07-05 DIAGNOSIS — Z0289 Encounter for other administrative examinations: Secondary | ICD-10-CM

## 2013-07-05 LAB — BASIC METABOLIC PANEL
BUN: 7 mg/dL (ref 6–23)
Chloride: 101 mEq/L (ref 96–112)
Potassium: 5.4 mEq/L — ABNORMAL HIGH (ref 3.5–5.3)
Sodium: 135 mEq/L (ref 135–145)

## 2013-07-10 ENCOUNTER — Telehealth: Payer: Self-pay | Admitting: Family Medicine

## 2013-07-10 DIAGNOSIS — R369 Urethral discharge, unspecified: Secondary | ICD-10-CM

## 2013-07-10 NOTE — Telephone Encounter (Signed)
Call pt: will refer him to URology since he is stil having sxs and so far w/u is neg.

## 2013-07-11 NOTE — Telephone Encounter (Signed)
Called and informed pt .Gerald Rogers  

## 2013-09-04 ENCOUNTER — Other Ambulatory Visit: Payer: Self-pay | Admitting: *Deleted

## 2013-09-04 MED ORDER — LOSARTAN POTASSIUM 100 MG PO TABS: 100.0000 mg | ORAL_TABLET | Freq: Every day | ORAL | Status: DC

## 2013-11-02 ENCOUNTER — Other Ambulatory Visit: Payer: Self-pay | Admitting: Family Medicine

## 2013-11-28 ENCOUNTER — Other Ambulatory Visit: Payer: Self-pay | Admitting: Family Medicine

## 2014-01-01 ENCOUNTER — Other Ambulatory Visit: Payer: Self-pay | Admitting: Family Medicine

## 2014-01-01 NOTE — Telephone Encounter (Signed)
Pt informed he will only receive 2 weeks worth of medication due to being pass due for appt.  Oscar La, LPN

## 2014-01-03 ENCOUNTER — Encounter: Payer: Self-pay | Admitting: Family Medicine

## 2014-01-03 ENCOUNTER — Ambulatory Visit (INDEPENDENT_AMBULATORY_CARE_PROVIDER_SITE_OTHER): Payer: PRIVATE HEALTH INSURANCE | Admitting: Family Medicine

## 2014-01-03 VITALS — BP 132/78 | HR 64 | Temp 97.6°F | Ht 64.0 in | Wt 135.0 lb

## 2014-01-03 DIAGNOSIS — Z23 Encounter for immunization: Secondary | ICD-10-CM

## 2014-01-03 DIAGNOSIS — I10 Essential (primary) hypertension: Secondary | ICD-10-CM

## 2014-01-03 DIAGNOSIS — E785 Hyperlipidemia, unspecified: Secondary | ICD-10-CM

## 2014-01-03 MED ORDER — LOSARTAN POTASSIUM 100 MG PO TABS: ORAL_TABLET | ORAL | Status: DC

## 2014-01-03 NOTE — Progress Notes (Signed)
   Subjective:    Patient ID: Gerald Rogers, male    DOB: 1961-04-22, 53 y.o.   MRN: [id number]  HPI  Hypertension- Pt denies chest pain, SOB, dizziness, or heart palpitations.  Taking meds as directed w/o problems.  Denies medication side effects.    Hyperlpidemia - tolerating the statin well with no S.E. No myalgias.   Review of Systems     Objective:   Physical Exam  Constitutional: He is oriented to person, place, and time. He appears well-developed and well-nourished.  HENT:  Head: Normocephalic and atraumatic.  Neck: Neck supple. No thyromegaly present.  Cardiovascular: Normal rate, regular rhythm and normal heart sounds.   No carotid bruits   Pulmonary/Chest: Effort normal and breath sounds normal.  Lymphadenopathy:    He has no cervical adenopathy.  Neurological: He is alert and oriented to person, place, and time.  Skin: Skin is warm and dry.  Psychiatric: He has a normal mood and affect. His behavior is normal.          Assessment & Plan:  HTN- well controlled.   F/U in 6 months.  RF sent to pharmacy.    Hyperlipidemia - well controlled.  F/U in 6 months. He is without insurance so will hold off on las for now.   Discussed ned for colon cancers screening. Givne stool care.

## 2014-06-22 ENCOUNTER — Other Ambulatory Visit: Payer: Self-pay | Admitting: Family Medicine

## 2014-07-03 ENCOUNTER — Ambulatory Visit: Payer: Self-pay | Admitting: Family Medicine

## 2014-07-04 ENCOUNTER — Ambulatory Visit: Payer: Self-pay | Admitting: Family Medicine

## 2014-07-10 ENCOUNTER — Telehealth: Payer: Self-pay

## 2014-07-10 ENCOUNTER — Ambulatory Visit: Payer: Self-pay | Admitting: Family Medicine

## 2014-07-10 NOTE — Telephone Encounter (Signed)
FYI Left message for patient to return call. He missed his appointment today for follow up hypertension and hyperlipidemia.

## 2014-07-12 ENCOUNTER — Ambulatory Visit (INDEPENDENT_AMBULATORY_CARE_PROVIDER_SITE_OTHER): Payer: Medicare Other | Admitting: Family Medicine

## 2014-07-12 ENCOUNTER — Encounter: Payer: Self-pay | Admitting: Family Medicine

## 2014-07-12 VITALS — BP 148/91 | HR 61 | Wt 128.0 lb

## 2014-07-12 DIAGNOSIS — I10 Essential (primary) hypertension: Secondary | ICD-10-CM

## 2014-07-12 DIAGNOSIS — E785 Hyperlipidemia, unspecified: Secondary | ICD-10-CM

## 2014-07-12 MED ORDER — PRAVASTATIN SODIUM 20 MG PO TABS: ORAL_TABLET | ORAL | Status: DC

## 2014-07-12 MED ORDER — LOSARTAN POTASSIUM 100 MG PO TABS: ORAL_TABLET | ORAL | Status: DC

## 2014-07-12 NOTE — Progress Notes (Signed)
   Subjective:    Patient ID: Gerald Rogers, male    DOB: 1961/10/28, 53 y.o.   MRN: [id number]  HPI Hypertension- Pt denies chest pain, SOB, dizziness, or heart palpitations.  Taking meds as directed w/o problems.  Denies medication side effects.    Hyperlipidemia  -  Tolerating  pravastating well.  Hasn't had lipids checked in 3 years.    Review of Systems     Objective:   Physical Exam  Constitutional: He is oriented to person, place, and time. He appears well-developed and well-nourished.  HENT:  Head: Normocephalic and atraumatic.  Cardiovascular: Normal rate, regular rhythm and normal heart sounds.   Pulmonary/Chest: Effort normal and breath sounds normal.  Neurological: He is alert and oriented to person, place, and time.  Skin: Skin is warm and dry.  Psychiatric: He has a normal mood and affect. His behavior is normal.          Assessment & Plan:  HTN- Well controlled on losartan.  F/U in 6 months.    Hyperlipidemia - due to repeat lipids.  He is without insurance but says he will check on the cose.

## 2014-07-13 LAB — BASIC METABOLIC PANEL WITH GFR
BUN: 12 mg/dL (ref 6–23)
CHLORIDE: 102 meq/L (ref 96–112)
CO2: 27 meq/L (ref 19–32)
Calcium: 9.7 mg/dL (ref 8.4–10.5)
Creat: 0.89 mg/dL (ref 0.50–1.35)
GFR, Est African American: >89 mL/min
GFR, Est Non African American: >89 mL/min
GLUCOSE: 79 mg/dL (ref 70–99)
Potassium: 4.4 mEq/L (ref 3.5–5.3)
Sodium: 139 mEq/L (ref 135–145)

## 2014-07-13 LAB — LIPID PANEL
CHOLESTEROL: 134 mg/dL (ref 0–200)
HDL: 39 mg/dL — ABNORMAL LOW (ref >39)
LDL Cholesterol: 74 mg/dL (ref 0–99)
Total CHOL/HDL Ratio: 3.4 Ratio
Triglycerides: 104 mg/dL (ref <150)
VLDL: 21 mg/dL (ref 0–40)

## 2015-01-13 ENCOUNTER — Encounter: Payer: Self-pay | Admitting: Family Medicine

## 2015-01-13 ENCOUNTER — Ambulatory Visit (INDEPENDENT_AMBULATORY_CARE_PROVIDER_SITE_OTHER): Payer: Medicare Other | Admitting: Family Medicine

## 2015-01-13 VITALS — BP 145/85 | HR 68 | Ht 64.0 in | Wt 137.0 lb

## 2015-01-13 DIAGNOSIS — I10 Essential (primary) hypertension: Secondary | ICD-10-CM

## 2015-01-13 DIAGNOSIS — Z720 Tobacco use: Secondary | ICD-10-CM

## 2015-01-13 DIAGNOSIS — R269 Unspecified abnormalities of gait and mobility: Secondary | ICD-10-CM | POA: Diagnosis not present

## 2015-01-13 MED ORDER — LOSARTAN POTASSIUM-HCTZ 100-25 MG PO TABS: 1.0000 | ORAL_TABLET | Freq: Every day | ORAL | Status: DC

## 2015-01-13 NOTE — Progress Notes (Signed)
   Subjective:    Patient ID: Gerald Rogers, male    DOB: 15-May-1961, 54 y.o.   MRN: [id number]  HPI Hypertension- Pt denies chest pain, SOB, dizziness, or heart palpitations.  Taking meds as directed w/o problems.  Denies medication side effects.  No regular exercise. He has a lot of balance issues that keep him from being very active.  He still continues to smoke one pack per day. His wife would like him to quit.  Review of Systems     Objective:   Physical Exam  Constitutional: He is oriented to person, place, and time. He appears well-developed and well-nourished.  HENT:  Head: Normocephalic and atraumatic.  Cardiovascular: Normal rate, regular rhythm and normal heart sounds.   Pulmonary/Chest: Effort normal and breath sounds normal.  Neurological: He is alert and oriented to person, place, and time.  Skin: Skin is warm and dry.  Psychiatric: He has a normal mood and affect. His behavior is normal.          Assessment & Plan:  HTN - uncontrolled. Even on repeat check today was still elevated and it was elevated 6 months ago. We'll go ahead and add Hodge chlorothiazide to his regimen. Recommend go for BMP in one week. Follow-up in 3 months. Did recommend chair exercises and things like that did help keep his strength that since he is not able to exercise regularly.  Tob abuse - encouarge cessation. We discussed options such as Wellbutrin and Chantix. Discussed the pros and cons of these medications encouraged him to think about it. I'll give him an additional handout on both of these. They were also interested in discussing nicotine replacement. Because he does smoke a pack per day are recommended using the patch along with the gum as needed for breakthrough symptoms. If he is interested then please let me know. I did give him a coupon off of the gum and patches.

## 2015-01-13 NOTE — Patient Instructions (Signed)
Varenicline oral tablets What is this medicine? VARENICLINE (var EN i kleen) is used to help people quit smoking. It can reduce the symptoms caused by stopping smoking. It is used with a patient support program recommended by your physician. This medicine may be used for other purposes; ask your health care provider or pharmacist if you have questions. COMMON BRAND NAME(S): Chantix What should I tell my health care provider before I take this medicine? They need to know if you have any of these conditions: -bipolar disorder, depression, schizophrenia or other mental illness -heart disease -if you often drink alcohol -kidney disease -peripheral vascular disease -seizures -stroke -suicidal thoughts, plans, or attempt; a previous suicide attempt by you or a family member -an unusual or allergic reaction to varenicline, other medicines, foods, dyes, or preservatives -pregnant or trying to get pregnant -breast-feeding How should I use this medicine? You should set a date to stop smoking and tell your doctor. Start this medicine one week before the quit date. You can also start taking this medicine before you choose a quit date, and then pick a quit date that is between 8 and 35 days of treatment with this medicine. Stick to your plan; ask about support groups or other ways to help you remain a 'quitter'. Take this medicine by mouth after eating. Take with a full glass of water. Follow the directions on the prescription label. Take your doses at regular intervals. Do not take your medicine more often than directed. A special MedGuide will be given to you by the pharmacist with each prescription and refill. Be sure to read this information carefully each time. Talk to your pediatrician regarding the use of this medicine in children. This medicine is not approved for use in children. Overdosage: If you think you have taken too much of this medicine contact a poison control center or emergency room at  once. NOTE: This medicine is only for you. Do not share this medicine with others. What if I miss a dose? If you miss a dose, take it as soon as you can. If it is almost time for your next dose, take only that dose. Do not take double or extra doses. What may interact with this medicine? -alcohol or any product that contains alcohol -insulin -other stop smoking aids -theophylline -warfarin This list may not describe all possible interactions. Give your health care provider a list of all the medicines, herbs, non-prescription drugs, or dietary supplements you use. Also tell them if you smoke, drink alcohol, or use illegal drugs. Some items may interact with your medicine. What should I watch for while using this medicine? Visit your doctor or health care professional for regular check ups. Ask for ongoing advice and encouragement from your doctor or healthcare professional, friends, and family to help you quit. If you smoke while on this medication, quit again Your mouth may get dry. Chewing sugarless gum or sucking hard candy, and drinking plenty of water may help. Contact your doctor if the problem does not go away or is severe. You may get drowsy or dizzy. Do not drive, use machinery, or do anything that needs mental alertness until you know how this medicine affects you. Do not stand or sit up quickly, especially if you are an older patient. This reduces the risk of dizzy or fainting spells. The use of this medicine may increase the chance of suicidal thoughts or actions. Pay special attention to how you are responding while on this medicine. Any worsening of   mood, or thoughts of suicide or dying should be reported to your health care professional right away. What side effects may I notice from receiving this medicine? Side effects that you should report to your doctor or health care professional as soon as possible: -allergic reactions like skin rash, itching or hives, swelling of the face,  lips, tongue, or throat -breathing problems -changes in vision -chest pain or chest tightness -confusion, trouble speaking or understanding -fast, irregular heartbeat -feeling faint or lightheaded, falls -fever -pain in legs when walking -problems with balance, talking, walking -redness, blistering, peeling or loosening of the skin, including inside the mouth -ringing in ears -seizures -sudden numbness or weakness of the face, arm or leg -suicidal thoughts or other mood changes -trouble passing urine or change in the amount of urine -unusual bleeding or bruising -unusually weak or tired Side effects that usually do not require medical attention (report to your doctor or health care professional if they continue or are bothersome): -constipation -headache -nausea, vomiting -strange dreams -stomach gas -trouble sleeping This list may not describe all possible side effects. Call your doctor for medical advice about side effects. You may report side effects to FDA at 1-800-FDA-1088. Where should I keep my medicine? Keep out of the reach of children. Store at room temperature between 15 and 30 degrees C (59 and 86 degrees F). Throw away any unused medicine after the expiration date. NOTE: This sheet is a summary. It may not cover all possible information. If you have questions about this medicine, talk to your doctor, pharmacist, or health care provider.  2015, Elsevier/Gold Standard. (2013-09-10 13:37:47) Bupropion sustained-release tablets (smoking cessation) What is this medicine? BUPROPION (byoo PROE pee on) is used to help people quit smoking. This medicine may be used for other purposes; ask your health care provider or pharmacist if you have questions. COMMON BRAND NAME(S): Buproban, Zyban What should I tell my health care provider before I take this medicine? They need to know if you have any of these conditions: -an eating disorder, such as anorexia or bulimia -bipolar  disorder or psychosis -diabetes or high blood sugar, treated with medication -glaucoma -head injury or brain tumor -heart disease, previous heart attack, or irregular heart beat -high blood pressure -kidney or liver disease -seizures -suicidal thoughts or a previous suicide attempt -Tourette's syndrome -weight loss -an unusual or allergic reaction to bupropion, other medicines, foods, dyes, or preservatives -breast-feeding -pregnant or trying to become pregnant How should I use this medicine? Take this medicine by mouth with a glass of water. Follow the directions on the prescription label. You can take it with or without food. If it upsets your stomach, take it with food. Do not cut, crush or chew this medicine. Take your medicine at regular intervals. If you take this medicine more than once a day, take your second dose at least 8 hours after you take your first dose. To limit difficulty in sleeping, avoid taking this medicine at bedtime. Do not take your medicine more often than directed. Do not stop taking this medicine suddenly except upon the advice of your doctor. Stopping this medicine too quickly may cause serious side effects. A special MedGuide will be given to you by the pharmacist with each prescription and refill. Be sure to read this information carefully each time. Talk to your pediatrician regarding the use of this medicine in children. Special care may be needed. Overdosage: If you think you have taken too much of this medicine contact a poison  control center or emergency room at once. NOTE: This medicine is only for you. Do not share this medicine with others. What if I miss a dose? If you miss a dose, skip the missed dose and take your next tablet at the regular time. There should be at least 8 hours between doses. Do not take double or extra doses. What may interact with this medicine? Do not take this medicine with any of the following medications: -linezolid -MAOIs  like Azilect, Carbex, Eldepryl, Marplan, Nardil, and Parnate -methylene blue (injected into a vein) -other medicines that contain bupropion like Wellbutrin This medicine may also interact with the following medications: -alcohol -certain medicines for anxiety or sleep -certain medicines for blood pressure like metoprolol, propranolol -certain medicines for depression or psychotic disturbances -certain medicines for HIV or AIDS like efavirenz, lopinavir, nelfinavir, ritonavir -certain medicines for irregular heart beat like propafenone, flecainide -certain medicines for Parkinson's disease like amantadine, levodopa -certain medicines for seizures like carbamazepine, phenytoin, phenobarbital -cimetidine -clopidogrel -cyclophosphamide -furazolidone -isoniazid -nicotine -orphenadrine -procarbazine -steroid medicines like prednisone or cortisone -stimulant medicines for attention disorders, weight loss, or to stay awake -tamoxifen -theophylline -thiotepa -ticlopidine -tramadol -warfarin This list may not describe all possible interactions. Give your health care provider a list of all the medicines, herbs, non-prescription drugs, or dietary supplements you use. Also tell them if you smoke, drink alcohol, or use illegal drugs. Some items may interact with your medicine. What should I watch for while using this medicine? Visit your doctor or health care professional for regular checks on your progress. This medicine should be used together with a patient support program. It is important to participate in a behavioral program, counseling, or other support program that is recommended by your health care professional. Patients and their families should watch out for new or worsening thoughts of suicide or depression. Also watch out for sudden changes in feelings such as feeling anxious, agitated, panicky, irritable, hostile, aggressive, impulsive, severely restless, overly excited and  hyperactive, or not being able to sleep. If this happens, especially at the beginning of treatment or after a change in dose, call your health care professional. Avoid alcoholic drinks while taking this medicine. Drinking excessive alcoholic beverages, using sleeping or anxiety medicines, or quickly stopping the use of these agents while taking this medicine may increase your risk for a seizure. Do not drive or use heavy machinery until you know how this medicine affects you. This medicine can impair your ability to perform these tasks. Do not take this medicine close to bedtime. It may prevent you from sleeping. Your mouth may get dry. Chewing sugarless gum or sucking hard candy, and drinking plenty of water may help. Contact your doctor if the problem does not go away or is severe. Do not use nicotine patches or chewing gum without the advice of your doctor or health care professional while taking this medicine. You may need to have your blood pressure taken regularly if your doctor recommends that you use both nicotine and this medicine together. What side effects may I notice from receiving this medicine? Side effects that you should report to your doctor or health care professional as soon as possible: -allergic reactions like skin rash, itching or hives, swelling of the face, lips, or tongue -breathing problems -changes in vision -confusion -fast or irregular heartbeat -hallucinations -increased blood pressure -redness, blistering, peeling or loosening of the skin, including inside the mouth -seizures -suicidal thoughts or other mood changes -unusually weak or tired -vomiting Side  effects that usually do not require medical attention (report to your doctor or health care professional if they continue or are bothersome): -change in sex drive or performance -constipation -headache -loss of appetite -nausea -tremors -weight loss This list may not describe all possible side effects.  Call your doctor for medical advice about side effects. You may report side effects to FDA at 1-800-FDA-1088. Where should I keep my medicine? Keep out of the reach of children. Store at room temperature between 20 and 25 degrees C (68 and 77 degrees F). Protect from light. Keep container tightly closed. Throw away any unused medicine after the expiration date. NOTE: This sheet is a summary. It may not cover all possible information. If you have questions about this medicine, talk to your doctor, pharmacist, or health care provider.  2015, Elsevier/Gold Standard. (2013-07-27 10:55:10)

## 2015-01-27 ENCOUNTER — Other Ambulatory Visit: Payer: Self-pay | Admitting: Family Medicine

## 2015-02-19 ENCOUNTER — Telehealth: Payer: Self-pay | Admitting: *Deleted

## 2015-02-19 NOTE — Telephone Encounter (Signed)
Pt's sister called wanting to know if her brother should be seen. She thinks he may be having some type of inner ear problems but she is unsure if this is related to his head injury and wanted Dr. Gardiner Ramus advise.  I left a vm informing her that if he is experiencing pain, fever, dizziness then she should have him to come in to be seen.

## 2015-03-10 ENCOUNTER — Ambulatory Visit: Payer: Self-pay | Admitting: Family Medicine

## 2015-04-23 ENCOUNTER — Encounter: Payer: Self-pay | Admitting: Family Medicine

## 2015-04-23 ENCOUNTER — Ambulatory Visit (INDEPENDENT_AMBULATORY_CARE_PROVIDER_SITE_OTHER): Payer: Medicare Other | Admitting: Family Medicine

## 2015-04-23 VITALS — BP 112/78 | HR 70 | Wt 134.0 lb

## 2015-04-23 DIAGNOSIS — Z1211 Encounter for screening for malignant neoplasm of colon: Secondary | ICD-10-CM | POA: Diagnosis not present

## 2015-04-23 DIAGNOSIS — Z23 Encounter for immunization: Secondary | ICD-10-CM | POA: Diagnosis not present

## 2015-04-23 DIAGNOSIS — I10 Essential (primary) hypertension: Secondary | ICD-10-CM

## 2015-04-23 NOTE — Progress Notes (Signed)
   Subjective:    Patient ID: Gerald Rogers, male    DOB: 08-02-1961, 54 y.o.   MRN: [id number]  HPI Hypertension- Pt denies chest pain, SOB, dizziness, or heart palpitations.  Taking meds as directed w/o problems.  Denies medication side effects.       Review of Systems     Objective:   Physical Exam  Constitutional: He is oriented to person, place, and time. He appears well-developed and well-nourished.  HENT:  Head: Normocephalic and atraumatic.  Cardiovascular: Normal rate, regular rhythm and normal heart sounds.   Pulmonary/Chest: Effort normal and breath sounds normal.  Neurological: He is alert and oriented to person, place, and time.  Skin: Skin is warm and dry.  Psychiatric: He has a normal mood and affect. His behavior is normal.          Assessment & Plan:  HTN- well controlled. Will check BMP today. He was supposed to go 2 months ago.  F/U in 6 months.   Given Tdap today.   Discussed need for screening colonoscopy. He is ok with referral.

## 2015-04-23 NOTE — Addendum Note (Signed)
Addended by: Teddy Spike on: 04/23/2015 10:11 AM   Modules accepted: Orders

## 2015-04-24 LAB — BASIC METABOLIC PANEL
BUN: 14 mg/dL (ref 6–23)
CALCIUM: 9.9 mg/dL (ref 8.4–10.5)
CO2: 29 mEq/L (ref 19–32)
Chloride: 97 mEq/L (ref 96–112)
Creat: 1.02 mg/dL (ref 0.50–1.35)
GLUCOSE: 81 mg/dL (ref 70–99)
Potassium: 4.5 mEq/L (ref 3.5–5.3)
Sodium: 134 mEq/L — ABNORMAL LOW (ref 135–145)

## 2015-07-04 DIAGNOSIS — K648 Other hemorrhoids: Secondary | ICD-10-CM | POA: Diagnosis not present

## 2015-07-04 DIAGNOSIS — Z7982 Long term (current) use of aspirin: Secondary | ICD-10-CM | POA: Diagnosis not present

## 2015-07-04 DIAGNOSIS — Z1211 Encounter for screening for malignant neoplasm of colon: Secondary | ICD-10-CM | POA: Diagnosis not present

## 2015-07-04 DIAGNOSIS — D124 Benign neoplasm of descending colon: Secondary | ICD-10-CM | POA: Diagnosis not present

## 2015-07-04 DIAGNOSIS — G40909 Epilepsy, unspecified, not intractable, without status epilepticus: Secondary | ICD-10-CM | POA: Diagnosis not present

## 2015-07-04 DIAGNOSIS — Z79899 Other long term (current) drug therapy: Secondary | ICD-10-CM | POA: Diagnosis not present

## 2015-07-04 DIAGNOSIS — F17200 Nicotine dependence, unspecified, uncomplicated: Secondary | ICD-10-CM | POA: Diagnosis not present

## 2015-07-04 DIAGNOSIS — I10 Essential (primary) hypertension: Secondary | ICD-10-CM | POA: Diagnosis not present

## 2015-07-11 ENCOUNTER — Other Ambulatory Visit: Payer: Self-pay | Admitting: Family Medicine

## 2015-07-12 ENCOUNTER — Other Ambulatory Visit: Payer: Self-pay | Admitting: Family Medicine

## 2015-07-22 ENCOUNTER — Other Ambulatory Visit: Payer: Self-pay | Admitting: *Deleted

## 2015-07-22 DIAGNOSIS — I10 Essential (primary) hypertension: Secondary | ICD-10-CM

## 2015-07-22 DIAGNOSIS — Z114 Encounter for screening for human immunodeficiency virus [HIV]: Secondary | ICD-10-CM

## 2015-07-22 DIAGNOSIS — Z13220 Encounter for screening for lipoid disorders: Secondary | ICD-10-CM

## 2015-07-22 DIAGNOSIS — Z1159 Encounter for screening for other viral diseases: Secondary | ICD-10-CM

## 2015-07-23 ENCOUNTER — Ambulatory Visit: Payer: Medicare Other | Admitting: Family Medicine

## 2015-07-23 DIAGNOSIS — Z114 Encounter for screening for human immunodeficiency virus [HIV]: Secondary | ICD-10-CM | POA: Diagnosis not present

## 2015-07-23 DIAGNOSIS — Z1159 Encounter for screening for other viral diseases: Secondary | ICD-10-CM | POA: Diagnosis not present

## 2015-07-23 DIAGNOSIS — Z13220 Encounter for screening for lipoid disorders: Secondary | ICD-10-CM | POA: Diagnosis not present

## 2015-07-23 DIAGNOSIS — I10 Essential (primary) hypertension: Secondary | ICD-10-CM | POA: Diagnosis not present

## 2015-07-24 LAB — COMPLETE METABOLIC PANEL WITH GFR
ALT: 25 U/L (ref 9–46)
AST: 18 U/L (ref 10–35)
Albumin: 3.9 g/dL (ref 3.6–5.1)
Alkaline Phosphatase: 75 U/L (ref 40–115)
BUN: 16 mg/dL (ref 7–25)
CALCIUM: 9.7 mg/dL (ref 8.6–10.3)
CHLORIDE: 100 mmol/L (ref 98–110)
CO2: 25 mmol/L (ref 20–31)
Creat: 0.88 mg/dL (ref 0.70–1.33)
GFR, Est African American: >89 mL/min (ref >=60)
GFR, Est Non African American: >89 mL/min (ref >=60)
Glucose, Bld: 95 mg/dL (ref 65–99)
POTASSIUM: 4.2 mmol/L (ref 3.5–5.3)
Sodium: 139 mmol/L (ref 135–146)
Total Bilirubin: 0.2 mg/dL (ref 0.2–1.2)
Total Protein: 6.6 g/dL (ref 6.1–8.1)

## 2015-07-24 LAB — LIPID PANEL
Cholesterol: 155 mg/dL (ref 125–200)
HDL: 30 mg/dL — ABNORMAL LOW (ref >=40)
LDL Cholesterol: 55 mg/dL (ref <130)
TRIGLYCERIDES: 349 mg/dL — AB (ref <150)
Total CHOL/HDL Ratio: 5.2 Ratio — ABNORMAL HIGH (ref <=5.0)
VLDL: 70 mg/dL — ABNORMAL HIGH (ref <30)

## 2015-07-24 LAB — HEPATITIS C ANTIBODY: HCV AB: NEGATIVE

## 2015-07-24 LAB — HIV ANTIBODY (ROUTINE TESTING W REFLEX): HIV: NONREACTIVE

## 2015-07-25 ENCOUNTER — Ambulatory Visit: Payer: Self-pay | Admitting: Family Medicine

## 2015-09-13 HISTORY — PX: VAGAL NERVE STIMULATOR REMOVAL: SHX2635

## 2015-10-12 ENCOUNTER — Other Ambulatory Visit: Payer: Self-pay | Admitting: Family Medicine

## 2015-10-13 ENCOUNTER — Other Ambulatory Visit: Payer: Self-pay

## 2015-10-16 DIAGNOSIS — K219 Gastro-esophageal reflux disease without esophagitis: Secondary | ICD-10-CM | POA: Insufficient documentation

## 2015-10-16 DIAGNOSIS — I10 Essential (primary) hypertension: Secondary | ICD-10-CM | POA: Insufficient documentation

## 2015-10-16 DIAGNOSIS — G40219 Localization-related (focal) (partial) symptomatic epilepsy and epileptic syndromes with complex partial seizures, intractable, without status epilepticus: Secondary | ICD-10-CM | POA: Insufficient documentation

## 2015-10-23 ENCOUNTER — Ambulatory Visit (INDEPENDENT_AMBULATORY_CARE_PROVIDER_SITE_OTHER): Payer: Self-pay | Admitting: Family Medicine

## 2015-10-23 DIAGNOSIS — Z0289 Encounter for other administrative examinations: Secondary | ICD-10-CM

## 2015-10-23 NOTE — Progress Notes (Signed)
   Subjective:    Patient ID: Gerald Rogers, male    DOB: Jan 09, 1961, 54 y.o.   MRN: [id number]  HPI No show for appt today for Blood pressure.  Beatrice Lecher, MD    Review of Systems     Objective:   Physical Exam        Assessment & Plan:

## 2015-10-28 ENCOUNTER — Ambulatory Visit: Payer: Medicare Other | Admitting: Family Medicine

## 2015-11-13 ENCOUNTER — Ambulatory Visit (INDEPENDENT_AMBULATORY_CARE_PROVIDER_SITE_OTHER): Payer: Medicare Other | Admitting: Family Medicine

## 2015-11-13 ENCOUNTER — Encounter: Payer: Self-pay | Admitting: Family Medicine

## 2015-11-13 VITALS — BP 102/67 | HR 70 | Temp 97.9°F | Resp 18 | Wt 131.9 lb

## 2015-11-13 DIAGNOSIS — E781 Pure hyperglyceridemia: Secondary | ICD-10-CM

## 2015-11-13 DIAGNOSIS — Z23 Encounter for immunization: Secondary | ICD-10-CM

## 2015-11-13 DIAGNOSIS — I10 Essential (primary) hypertension: Secondary | ICD-10-CM

## 2015-11-13 NOTE — Progress Notes (Signed)
   Subjective:    Patient ID: Gerald Rogers, male    DOB: 06/07/1961, 54 y.o.   MRN: [id number]  HPI Hypertension- Pt denies chest pain, SOB, dizziness, or heart palpitations.  Taking meds as directed w/o problems.  Denies medication side effects.  Taking losartan/HCTZ and pravastatin. Tolerating medications without any side effects.  Hypertriglyceridemia-his levels were way up at last office visit. He says it is her murmur eating anything specific that would have driven his levels up. He does take a statin and does well with it without any myalgias or side effects.  Review of Systems     Objective:   Physical Exam  Constitutional: He is oriented to person, place, and time. He appears well-developed and well-nourished.  HENT:  Head: Normocephalic and atraumatic.  Cardiovascular: Normal rate, regular rhythm and normal heart sounds.   Pulmonary/Chest: Effort normal and breath sounds normal.  Neurological: He is alert and oriented to person, place, and time.  Skin: Skin is warm and dry.  Psychiatric: He has a normal mood and affect. His behavior is normal.          Assessment & Plan:  HTN - well controlled. 5 blood pressure is a little bit low today. His wife says that she will mail me his blood pressure from about a week ago and he has another doctor's appointment next week and they will check it then. If it's staying low them actually been a decrease of his blood pressure medicine. Otherwise I'll see him back in 6 months. He reports that he had blood work done recently. We will try to get a copy of that.  Hypertriglyceridemia-due to recheck lipid levels at his follow-up visit. Next  Flu vaccine given today.

## 2015-12-11 ENCOUNTER — Telehealth: Payer: Self-pay | Admitting: Family Medicine

## 2015-12-11 DIAGNOSIS — R399 Unspecified symptoms and signs involving the genitourinary system: Secondary | ICD-10-CM

## 2015-12-11 NOTE — Telephone Encounter (Signed)
Pt states he was supposed to be referred to urology and no referral was placed. Pt self scheduled an appt for 12/30/15 at 1330 with Dr. Milford Cage in [place]. Will route to PCP for review on referral order.

## 2016-01-07 ENCOUNTER — Other Ambulatory Visit: Payer: Self-pay | Admitting: Family Medicine

## 2016-02-10 DIAGNOSIS — R32 Unspecified urinary incontinence: Secondary | ICD-10-CM | POA: Diagnosis not present

## 2016-03-09 DIAGNOSIS — R32 Unspecified urinary incontinence: Secondary | ICD-10-CM | POA: Diagnosis not present

## 2016-04-20 DIAGNOSIS — R32 Unspecified urinary incontinence: Secondary | ICD-10-CM | POA: Diagnosis not present

## 2016-05-13 ENCOUNTER — Ambulatory Visit (INDEPENDENT_AMBULATORY_CARE_PROVIDER_SITE_OTHER): Payer: Medicare Other | Admitting: Family Medicine

## 2016-05-13 ENCOUNTER — Encounter: Payer: Self-pay | Admitting: Family Medicine

## 2016-05-13 VITALS — BP 121/67 | HR 68 | Wt 130.0 lb

## 2016-05-13 DIAGNOSIS — E785 Hyperlipidemia, unspecified: Secondary | ICD-10-CM | POA: Diagnosis not present

## 2016-05-13 DIAGNOSIS — I10 Essential (primary) hypertension: Secondary | ICD-10-CM

## 2016-05-13 LAB — BASIC METABOLIC PANEL
BUN: 13 mg/dL (ref 7–25)
CALCIUM: 9.8 mg/dL (ref 8.6–10.3)
CO2: 27 mmol/L (ref 20–31)
Chloride: 100 mmol/L (ref 98–110)
Creat: 1.03 mg/dL (ref 0.70–1.33)
GLUCOSE: 86 mg/dL (ref 65–99)
POTASSIUM: 4.4 mmol/L (ref 3.5–5.3)
SODIUM: 137 mmol/L (ref 135–146)

## 2016-05-13 MED ORDER — LOSARTAN POTASSIUM-HCTZ 100-25 MG PO TABS: 1.0000 | ORAL_TABLET | Freq: Every day | ORAL | Status: DC

## 2016-05-13 MED ORDER — PRAVASTATIN SODIUM 20 MG PO TABS: 20.0000 mg | ORAL_TABLET | Freq: Every day | ORAL | Status: DC

## 2016-05-13 NOTE — Progress Notes (Signed)
Subjective:    CC: HTN HPI:  Hypertension- Pt denies chest pain, SOB, dizziness, or heart palpitations.  Taking meds as directed w/o problems.  Denies medication side effects.    Hyperlipidemia - currently on pravastatin 20 mg at bedtime. Tolerating well without any side effects or myalgias.  Past medical history, Surgical history, Family history not pertinant except as noted below, Social history, Allergies, and medications have been entered into the medical record, reviewed, and corrections made.   Review of Systems: No fevers, chills, night sweats, weight loss, chest pain, or shortness of breath.   Objective:    General: Well Developed, well nourished, and in no acute distress.  Neuro: Alert and oriented x3, extra-ocular muscles intact, sensation grossly intact.  HEENT: Normocephalic, atraumatic  Skin: Warm and dry, no rashes. Cardiac: Regular rate and rhythm, no murmurs rubs or gallops, no lower extremity edema.  Respiratory: Clear to auscultation bilaterally. Not using accessory muscles, speaking in full sentences.   Impression and Recommendations:   HTN  - Currently on losartan HCT. Well controlled. Continue current regimen. Follow up in 6 months.   Hyperlipidemia-continue current regimen. Last lipid level was in August 2016. He'll be due again in a couple months.

## 2016-05-14 NOTE — Progress Notes (Signed)
Quick Note:  All labs are normal. ______ 

## 2016-05-26 DIAGNOSIS — R32 Unspecified urinary incontinence: Secondary | ICD-10-CM | POA: Diagnosis not present

## 2016-06-01 DIAGNOSIS — R32 Unspecified urinary incontinence: Secondary | ICD-10-CM | POA: Diagnosis not present

## 2016-07-13 DIAGNOSIS — R32 Unspecified urinary incontinence: Secondary | ICD-10-CM | POA: Diagnosis not present

## 2016-10-21 DIAGNOSIS — G609 Hereditary and idiopathic neuropathy, unspecified: Secondary | ICD-10-CM | POA: Diagnosis not present

## 2016-10-21 DIAGNOSIS — R270 Ataxia, unspecified: Secondary | ICD-10-CM | POA: Diagnosis not present

## 2016-10-21 DIAGNOSIS — R202 Paresthesia of skin: Secondary | ICD-10-CM | POA: Diagnosis not present

## 2016-11-12 ENCOUNTER — Ambulatory Visit: Payer: Self-pay | Admitting: Family Medicine

## 2016-11-12 NOTE — Progress Notes (Deleted)
Subjective:    CC: HTN  HPI:  Hypertension- Pt denies chest pain, SOB, dizziness, or heart palpitations.  Taking meds as directed w/o problems.  Denies medication side effects.    Hyperlipidemia -   Past medical history, Surgical history, Family history not pertinant except as noted below, Social history, Allergies, and medications have been entered into the medical record, reviewed, and corrections made.   Review of Systems: No fevers, chills, night sweats, weight loss, chest pain, or shortness of breath.   Objective:    General: Well Developed, well nourished, and in no acute distress.  Neuro: Alert and oriented x3, extra-ocular muscles intact, sensation grossly intact.  HEENT: Normocephalic, atraumatic  Skin: Warm and dry, no rashes. Cardiac: Regular rate and rhythm, no murmurs rubs or gallops, no lower extremity edema.  Respiratory: Clear to auscultation bilaterally. Not using accessory muscles, speaking in full sentences.   Impression and Recommendations:    HTN -   Hyperlipidemia -

## 2016-12-26 ENCOUNTER — Other Ambulatory Visit: Payer: Self-pay | Admitting: Family Medicine

## 2016-12-26 DIAGNOSIS — I10 Essential (primary) hypertension: Secondary | ICD-10-CM

## 2017-02-02 ENCOUNTER — Other Ambulatory Visit: Payer: Self-pay | Admitting: Family Medicine

## 2017-02-02 DIAGNOSIS — I10 Essential (primary) hypertension: Secondary | ICD-10-CM

## 2017-02-15 ENCOUNTER — Encounter: Payer: Self-pay | Admitting: Family Medicine

## 2017-02-15 ENCOUNTER — Ambulatory Visit (INDEPENDENT_AMBULATORY_CARE_PROVIDER_SITE_OTHER): Payer: Medicare Other | Admitting: Family Medicine

## 2017-02-15 VITALS — BP 123/77 | HR 68 | Ht 64.0 in | Wt 125.0 lb

## 2017-02-15 DIAGNOSIS — R251 Tremor, unspecified: Secondary | ICD-10-CM

## 2017-02-15 DIAGNOSIS — Z125 Encounter for screening for malignant neoplasm of prostate: Secondary | ICD-10-CM | POA: Diagnosis not present

## 2017-02-15 DIAGNOSIS — Z23 Encounter for immunization: Secondary | ICD-10-CM | POA: Diagnosis not present

## 2017-02-15 DIAGNOSIS — Z9689 Presence of other specified functional implants: Secondary | ICD-10-CM | POA: Insufficient documentation

## 2017-02-15 DIAGNOSIS — I10 Essential (primary) hypertension: Secondary | ICD-10-CM

## 2017-02-15 DIAGNOSIS — E7800 Pure hypercholesterolemia, unspecified: Secondary | ICD-10-CM | POA: Diagnosis not present

## 2017-02-15 LAB — COMPLETE METABOLIC PANEL WITH GFR
ALBUMIN: 4.2 g/dL (ref 3.6–5.1)
ALK PHOS: 87 U/L (ref 40–115)
ALT: 22 U/L (ref 9–46)
AST: 15 U/L (ref 10–35)
BILIRUBIN TOTAL: 0.3 mg/dL (ref 0.2–1.2)
BUN: 13 mg/dL (ref 7–25)
CALCIUM: 10 mg/dL (ref 8.6–10.3)
CO2: 32 mmol/L — ABNORMAL HIGH (ref 20–31)
Chloride: 95 mmol/L — ABNORMAL LOW (ref 98–110)
Creat: 0.87 mg/dL (ref 0.70–1.33)
GFR, Est African American: >89 mL/min (ref >=60)
GFR, Est Non African American: >89 mL/min (ref >=60)
GLUCOSE: 75 mg/dL (ref 65–99)
Potassium: 3.8 mmol/L (ref 3.5–5.3)
SODIUM: 135 mmol/L (ref 135–146)
TOTAL PROTEIN: 7.2 g/dL (ref 6.1–8.1)

## 2017-02-15 LAB — LIPID PANEL W/REFLEX DIRECT LDL
CHOL/HDL RATIO: 3.4 ratio (ref <5.0)
CHOLESTEROL: 153 mg/dL (ref <200)
HDL: 45 mg/dL (ref >40)
LDL-CHOLESTEROL: 86 mg/dL
NON-HDL CHOLESTEROL (CALC): 108 mg/dL (ref <130)
Triglycerides: 121 mg/dL (ref <150)

## 2017-02-15 MED ORDER — LOSARTAN POTASSIUM-HCTZ 100-25 MG PO TABS: 1.0000 | ORAL_TABLET | Freq: Every day | ORAL | 1 refills | Status: DC

## 2017-02-15 NOTE — Progress Notes (Signed)
Subjective:    Patient ID: Gerald Rogers, male    DOB: 1961-01-23, 56 y.o.   MRN: [id number]  HPI 56 year old male comes in today to follow-up on his hypertension and cholesterol. He recently had a vagal nerve stimulator placed with Dr. Trula Ore, his neurologist. It has been helpful for his seizures as well as his tremor. He is still having to use a walker to ambulate. He is here with his sister today.   Hypertension- Pt denies chest pain, SOB, dizziness, or heart palpitations. No lower extremity edema. Taking meds as directed w/o problems.  Denies medication side effects.  He does still take a daily aspirin.  Per the peri-currently on pravastatin. Tolerating well without any side effects or myalgias.  Review of Systems   BP 123/77   Pulse 68   Ht 5\' 4"  (1.626 m)   Wt 125 lb (56.7 kg)   SpO2 100%   BMI 21.46 kg/m     Allergies  Allergen Reactions  . Cymbalta [Duloxetine Hcl] Other (See Comments)    Generalized weakness and mental status changes  . Klonopin [Clonazepam] Other (See Comments)    Mental status changes    Past Medical History:  Diagnosis Date  . Hypertension     Past Surgical History:  Procedure Laterality Date  . VAGAL NERVE STIMULATOR REMOVAL  09/2015   Dr. Gloriann Loan     Social History   Social History  . Marital status: Married    Spouse name: N/A  . Number of children: N/A  . Years of education: N/A   Occupational History  . Not on file.   Social History Main Topics  . Smoking status: Current Every Day Smoker    Packs/day: 1.00    Types: Cigarettes    Start date: 12/13/1970  . Smokeless tobacco: Never Used  . Alcohol use 0.0 oz/week  . Drug use: No  . Sexual activity: Yes    Partners: Female   Other Topics Concern  . Not on file   Social History Narrative   No regular exercise.      Family History  Problem Relation Age of Onset  . Heart disease Mother 57  . Hypertension Mother   . Cancer Father   . Stroke Maternal Aunt      Outpatient Encounter Prescriptions as of 02/15/2017  Medication Sig  . aspirin 325 MG tablet Take 325 mg by mouth daily.  . diazepam (VALIUM) 5 MG tablet Take 5 mg by mouth daily.   Marland Kitchen gabapentin (NEURONTIN) 600 MG tablet   . LamoTRIgine XR 200 MG TB24   . losartan-hydrochlorothiazide (HYZAAR) 100-25 MG tablet Take 1 tablet by mouth daily.  . pravastatin (PRAVACHOL) 20 MG tablet Take 1 tablet (20 mg total) by mouth daily.  . propranolol ER (INDERAL LA) 80 MG 24 hr capsule Take 80 mg by mouth daily.   . [DISCONTINUED] losartan-hydrochlorothiazide (HYZAAR) 100-25 MG tablet Take 1 tablet by mouth daily. NEED FOLLOW UP APPOINTMENT FOR MORE REFILLS   No facility-administered encounter medications on file as of 02/15/2017.          Objective:   Physical Exam  Constitutional: He is oriented to person, place, and time. He appears well-developed and well-nourished.  HENT:  Head: Normocephalic and atraumatic.  Cardiovascular: Normal rate, regular rhythm and normal heart sounds.   Pulmonary/Chest: Effort normal and breath sounds normal.  Neurological: He is alert and oriented to person, place, and time.  Skin: Skin is warm and dry.  Psychiatric: He  has a normal mood and affect. His behavior is normal.          Assessment & Plan:  HTN - Controlled. Continue current regimen. Follow-up in 6-9 months.  Hyperlipidemia - due for CMP and lipid make sure liver enzymes are at goal. Continue with pravastatin.  Tremor - now has a vagal nerve stimulator in place. He does feel like his symptoms have improved. Still on propranolol.  Flu vaccine given today.

## 2017-02-16 LAB — PSA: PSA: 0.5 ng/mL (ref <=4.0)

## 2017-02-16 NOTE — Progress Notes (Signed)
All labs are normal. 

## 2017-02-17 ENCOUNTER — Other Ambulatory Visit: Payer: Self-pay | Admitting: Family Medicine

## 2017-02-17 DIAGNOSIS — I10 Essential (primary) hypertension: Secondary | ICD-10-CM

## 2017-04-11 DIAGNOSIS — R404 Transient alteration of awareness: Secondary | ICD-10-CM | POA: Diagnosis not present

## 2017-04-11 DIAGNOSIS — R1111 Vomiting without nausea: Secondary | ICD-10-CM | POA: Diagnosis not present

## 2017-04-11 DIAGNOSIS — R0602 Shortness of breath: Secondary | ICD-10-CM | POA: Diagnosis not present

## 2017-04-11 DIAGNOSIS — R1110 Vomiting, unspecified: Secondary | ICD-10-CM | POA: Diagnosis not present

## 2017-04-11 DIAGNOSIS — Z8673 Personal history of transient ischemic attack (TIA), and cerebral infarction without residual deficits: Secondary | ICD-10-CM | POA: Diagnosis not present

## 2017-04-11 DIAGNOSIS — R55 Syncope and collapse: Secondary | ICD-10-CM | POA: Diagnosis not present

## 2017-04-11 DIAGNOSIS — I10 Essential (primary) hypertension: Secondary | ICD-10-CM | POA: Diagnosis not present

## 2017-05-03 DIAGNOSIS — R252 Cramp and spasm: Secondary | ICD-10-CM | POA: Insufficient documentation

## 2017-06-04 DIAGNOSIS — Z720 Tobacco use: Secondary | ICD-10-CM | POA: Insufficient documentation

## 2017-06-04 DIAGNOSIS — F17200 Nicotine dependence, unspecified, uncomplicated: Secondary | ICD-10-CM | POA: Insufficient documentation

## 2017-06-04 DIAGNOSIS — I770 Arteriovenous fistula, acquired: Secondary | ICD-10-CM | POA: Insufficient documentation

## 2017-07-02 ENCOUNTER — Other Ambulatory Visit: Payer: Self-pay | Admitting: Family Medicine

## 2017-07-02 DIAGNOSIS — E785 Hyperlipidemia, unspecified: Secondary | ICD-10-CM

## 2017-07-04 ENCOUNTER — Other Ambulatory Visit: Payer: Self-pay | Admitting: Family Medicine

## 2017-07-04 DIAGNOSIS — I10 Essential (primary) hypertension: Secondary | ICD-10-CM

## 2017-08-18 ENCOUNTER — Ambulatory Visit (INDEPENDENT_AMBULATORY_CARE_PROVIDER_SITE_OTHER): Payer: Medicare Other | Admitting: Family Medicine

## 2017-08-18 VITALS — BP 104/60 | HR 59 | Temp 98.4°F | Wt 126.0 lb

## 2017-08-18 DIAGNOSIS — R269 Unspecified abnormalities of gait and mobility: Secondary | ICD-10-CM

## 2017-08-18 DIAGNOSIS — I10 Essential (primary) hypertension: Secondary | ICD-10-CM

## 2017-08-18 DIAGNOSIS — Z23 Encounter for immunization: Secondary | ICD-10-CM

## 2017-08-18 DIAGNOSIS — E7800 Pure hypercholesterolemia, unspecified: Secondary | ICD-10-CM

## 2017-08-18 MED ORDER — LOSARTAN POTASSIUM-HCTZ 100-25 MG PO TABS: 1.0000 | ORAL_TABLET | Freq: Every day | ORAL | 3 refills | Status: DC

## 2017-08-18 NOTE — Progress Notes (Signed)
Subjective:    CC: HTN  HPI:  Hypertension- Pt denies chest pain, SOB, dizziness, or heart palpitations.  Taking meds as directed w/o problems.  Denies medication side effects.    Hyperlipidemia-tolerate statin well without any side effects.  Lab Results  Component Value Date   CHOL 153 02/15/2017   HDL 45 02/15/2017   LDLCALC 55 07/23/2015   TRIG 121 02/15/2017   CHOLHDL 3.4 02/15/2017   Unfortunately his gait instability continues to get worse. He is now primarily using a wheelchair. Even with using a walker he is incredibly off-balance. He is now seeing a new neurologist, Dr. Erling Cruz and they have done some additional workup. 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 They're now wanting to look at the his neck but he has a hardware device in so they cannot get an MRI at this point. He is also seeing Dr. Arizona Constable.he has not had an eye exam in years. Occasionally gets double vision.he is here today in a wheelchair.    Past medical history, Surgical history, Family history not pertinant except as noted below, Social history, Allergies, and medications have been entered into the medical record, reviewed, and corrections made.   Review of Systems: No fevers, chills, night sweats, weight loss, chest pain, or shortness of breath.   Objective:    General: Well Developed, well nourished, and in no acute distress.  Neuro: Alert and oriented x3, extra-ocular muscles intact, sensation grossly intact.  HEENT: Normocephalic, atraumatic  Skin: Warm and dry, no rashes. Cardiac: Regular rate and rhythm, no murmurs rubs or gallops, no lower extremity edema.  Respiratory: Clear to auscultation bilaterally. Not using accessory muscles, speaking in full sentences.   Impression and Recommendations:    HTN - Well controlled. Continue current regimen. Follow up in  6 months.    Hyperlipidemian - Well controlled. Continue  current regimen. Follow up in  6 months.   Double vision-encouraged him and his wife to schedule an eye exam since he has not had one in years. Explained that a mandibular find something that might help with his diagnosis . Now seeing Dr. Erling Cruz. No win a Database administrator.

## 2017-09-26 ENCOUNTER — Other Ambulatory Visit: Payer: Self-pay | Admitting: Family Medicine

## 2017-12-16 DIAGNOSIS — I10 Essential (primary) hypertension: Secondary | ICD-10-CM | POA: Diagnosis not present

## 2017-12-16 DIAGNOSIS — S0101XA Laceration without foreign body of scalp, initial encounter: Secondary | ICD-10-CM | POA: Diagnosis not present

## 2017-12-16 DIAGNOSIS — Z8673 Personal history of transient ischemic attack (TIA), and cerebral infarction without residual deficits: Secondary | ICD-10-CM | POA: Diagnosis not present

## 2017-12-16 DIAGNOSIS — W19XXXA Unspecified fall, initial encounter: Secondary | ICD-10-CM | POA: Diagnosis not present

## 2017-12-16 DIAGNOSIS — R51 Headache: Secondary | ICD-10-CM | POA: Diagnosis not present

## 2017-12-16 DIAGNOSIS — Z720 Tobacco use: Secondary | ICD-10-CM | POA: Diagnosis not present

## 2017-12-26 DIAGNOSIS — Z720 Tobacco use: Secondary | ICD-10-CM | POA: Diagnosis not present

## 2017-12-26 DIAGNOSIS — Z4802 Encounter for removal of sutures: Secondary | ICD-10-CM | POA: Diagnosis not present

## 2017-12-26 DIAGNOSIS — S0101XD Laceration without foreign body of scalp, subsequent encounter: Secondary | ICD-10-CM | POA: Diagnosis not present

## 2018-02-15 ENCOUNTER — Ambulatory Visit (INDEPENDENT_AMBULATORY_CARE_PROVIDER_SITE_OTHER): Payer: Medicare Other | Admitting: Family Medicine

## 2018-02-15 ENCOUNTER — Encounter: Payer: Self-pay | Admitting: Family Medicine

## 2018-02-15 VITALS — BP 133/76 | HR 58 | Ht 64.0 in | Wt 124.0 lb

## 2018-02-15 DIAGNOSIS — Z125 Encounter for screening for malignant neoplasm of prostate: Secondary | ICD-10-CM | POA: Diagnosis not present

## 2018-02-15 DIAGNOSIS — E7800 Pure hypercholesterolemia, unspecified: Secondary | ICD-10-CM

## 2018-02-15 DIAGNOSIS — Z720 Tobacco use: Secondary | ICD-10-CM | POA: Diagnosis not present

## 2018-02-15 DIAGNOSIS — I10 Essential (primary) hypertension: Secondary | ICD-10-CM | POA: Diagnosis not present

## 2018-02-15 NOTE — Progress Notes (Signed)
Subjective:    Patient ID: Gerald Rogers, male    DOB: [birth year], 59 y.o.   MRN: [id number]  HPI Hypertension- Pt denies chest pain, SOB, dizziness, or heart palpitations.  Taking meds as directed w/o problems.  Denies medication side effects.    Hyperlipidemia-currently on pravastatin and tolerating well without any problems or side effects.  Did want to give me an update.  They feel like he is not having epilepsy and so they have actually removed his vagal nerve stimulator.  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  He has had 3 consultations that have confirmed this diagnosis and is hopeful to have surgery that might be able to repair the fistula.  He is also attempted to quit smoking.  He smoked his last cigarette this past weekend and is vaping instead but plans on weaning down his nicotine.  Review of Systems  BP 133/76   Pulse (!) 58   Ht 5\' 4"  (1.626 m)   Wt 124 lb (56.2 kg)   SpO2 98%   BMI 21.28 kg/m     Allergies  Allergen Reactions  . Cymbalta [Duloxetine Hcl] Other (See Comments)    Generalized weakness and mental status changes  . Klonopin [Clonazepam] Other (See Comments)    Mental status changes    Past Medical History:  Diagnosis Date  . Hypertension     Past Surgical History:  Procedure Laterality Date  . VAGAL NERVE STIMULATOR REMOVAL  09/2015   Dr. Gloriann Loan     Social History   Socioeconomic History  . Marital status: Married    Spouse name: Not on file  . Number of children: Not on file  . Years of education: Not on file  . Highest education level: Not on file  Social Needs  . Financial resource strain: Not on file  . Food insecurity - worry: Not on file  . Food insecurity - inability: Not on file  . Transportation needs - medical: Not on file  . Transportation needs - non-medical: Not on file  Occupational History  . Not on file  Tobacco Use  . Smoking status: Former Smoker     Packs/day: 1.00    Types: Cigarettes    Start date: 12/13/1970    Last attempt to quit: 02/11/2018    Years since quitting: 0.0  . Smokeless tobacco: Never Used  Substance and Sexual Activity  . Alcohol use: Yes    Alcohol/week: 0.0 oz  . Drug use: No  . Sexual activity: Yes    Partners: Female  Other Topics Concern  . Not on file  Social History Narrative   No regular exercise.      Family History  Problem Relation Age of Onset  . Heart disease Mother 64  . Hypertension Mother   . Cancer Father   . Stroke Maternal Aunt     Outpatient Encounter Medications as of 02/15/2018  Medication Sig  . aspirin 325 MG tablet Take 325 mg by mouth daily.  . baclofen (LIORESAL) 10 MG tablet Take 10 mg by mouth 3 (three) times daily as needed.  . diazepam (VALIUM) 5 MG tablet Take 5 mg by mouth daily.   Marland Kitchen gabapentin (NEURONTIN) 300 MG capsule Take 900 mg by mouth 2 (two) times daily.  . LamoTRIgine XR 200 MG TB24   . losartan-hydrochlorothiazide (HYZAAR) 100-25 MG tablet Take 1 tablet by mouth daily.  . pravastatin (PRAVACHOL) 20  MG tablet TAKE 1 TABLET (20 MG TOTAL) BY MOUTH DAILY.  Marland Kitchen propranolol ER (INDERAL LA) 80 MG 24 hr capsule Take 80 mg by mouth daily.    No facility-administered encounter medications on file as of 02/15/2018.          Objective:   Physical Exam  Constitutional: He is oriented to person, place, and time. He appears well-developed and well-nourished.  HENT:  Head: Normocephalic and atraumatic.  Cardiovascular: Normal rate, regular rhythm and normal heart sounds.  Pulmonary/Chest: Effort normal and breath sounds normal.  Neurological: He is alert and oriented to person, place, and time.  Skin: Skin is warm and dry.  Psychiatric: He has a normal mood and affect. His behavior is normal.        Assessment & Plan:  HTN -well controlled.  Continue current regimen.  Due for CMP and lipid panel.  Hyperlipidemia -due to recheck lipids.  Continue with  pravastatin.  Vagal nerve stimulator removed.  Updated chart.  He will keep me updated on whether or not he ends up having surgery on his spine.  Tobacco abuse-congratulated him on quitting smoking cigarettes.  Just encouraged him to continue to work on the nicotine content of the vaporizer.

## 2018-03-08 ENCOUNTER — Telehealth: Payer: Self-pay | Admitting: Family Medicine

## 2018-03-08 DIAGNOSIS — I10 Essential (primary) hypertension: Secondary | ICD-10-CM | POA: Diagnosis not present

## 2018-03-08 DIAGNOSIS — Z125 Encounter for screening for malignant neoplasm of prostate: Secondary | ICD-10-CM | POA: Diagnosis not present

## 2018-03-08 DIAGNOSIS — E7800 Pure hypercholesterolemia, unspecified: Secondary | ICD-10-CM | POA: Diagnosis not present

## 2018-03-08 NOTE — Telephone Encounter (Signed)
Patient sister Almyra Free called and she states that Gerald Rogers needs a form filled out clearing him for spine surgery. Does he need an appointment since he was last seen on 02/15/2018. Please advise.

## 2018-03-08 NOTE — Telephone Encounter (Signed)
It should be okay.  Please have him go ahead and fax the form over and I will take a look at it.

## 2018-03-08 NOTE — Telephone Encounter (Signed)
Patient's wife advised

## 2018-03-09 LAB — COMPLETE METABOLIC PANEL WITH GFR
AG RATIO: 1.5 (calc) (ref 1.0–2.5)
ALBUMIN MSPROF: 4.1 g/dL (ref 3.6–5.1)
ALT: 38 U/L (ref 9–46)
AST: 20 U/L (ref 10–35)
Alkaline phosphatase (APISO): 72 U/L (ref 40–115)
BILIRUBIN TOTAL: 0.3 mg/dL (ref 0.2–1.2)
BUN: 16 mg/dL (ref 7–25)
CHLORIDE: 98 mmol/L (ref 98–110)
CO2: 34 mmol/L — ABNORMAL HIGH (ref 20–32)
Calcium: 10.2 mg/dL (ref 8.6–10.3)
Creat: 1.01 mg/dL (ref 0.70–1.33)
GFR, EST AFRICAN AMERICAN: 95 mL/min/{1.73_m2} (ref >=60)
GFR, Est Non African American: 82 mL/min/{1.73_m2} (ref >=60)
Globulin: 2.8 g/dL (calc) (ref 1.9–3.7)
Glucose, Bld: 79 mg/dL (ref 65–99)
POTASSIUM: 4.2 mmol/L (ref 3.5–5.3)
Sodium: 140 mmol/L (ref 135–146)
TOTAL PROTEIN: 6.9 g/dL (ref 6.1–8.1)

## 2018-03-09 LAB — LIPID PANEL
Cholesterol: 152 mg/dL (ref <200)
HDL: 50 mg/dL (ref >40)
LDL CHOLESTEROL (CALC): 77 mg/dL
NON-HDL CHOLESTEROL (CALC): 102 mg/dL (ref <130)
Total CHOL/HDL Ratio: 3 (calc) (ref <5.0)
Triglycerides: 148 mg/dL (ref <150)

## 2018-03-09 LAB — PSA: PSA: 0.5 ng/mL (ref <=4.0)

## 2018-03-16 DIAGNOSIS — M489 Spondylopathy, unspecified: Secondary | ICD-10-CM | POA: Diagnosis not present

## 2018-03-16 DIAGNOSIS — R490 Dysphonia: Secondary | ICD-10-CM | POA: Insufficient documentation

## 2018-03-16 NOTE — H&P (Signed)
Patient ID: Gerald Rogers MRN: [id number] DOB/AGE: 08-18-1961 57 y.o.  Admit date: (Not on file)  Admission Diagnoses:  Cervical myelopathy  HPI: Pleasant male pt will present to [facility] for a Cervical Fusion C4-6 on 03/23/18.   Past Medical History: Past Medical History:  Diagnosis Date  . Hypertension     Surgical History: Past Surgical History:  Procedure Laterality Date  . VAGAL NERVE STIMULATOR REMOVAL  09/2015   Dr. Gloriann Loan     Family History: Family History  Problem Relation Age of Onset  . Heart disease Mother 50  . Hypertension Mother   . Cancer Father   . Stroke Maternal Aunt     Social History: Social History   Socioeconomic History  . Marital status: Married    Spouse name: Not on file  . Number of children: Not on file  . Years of education: Not on file  . Highest education level: Not on file  Occupational History  . Not on file  Social Needs  . Financial resource strain: Not on file  . Food insecurity:    Worry: Not on file    Inability: Not on file  . Transportation needs:    Medical: Not on file    Non-medical: Not on file  Tobacco Use  . Smoking status: Former Smoker    Packs/day: 1.00    Types: Cigarettes    Start date: 12/13/1970    Last attempt to quit: 02/11/2018    Years since quitting: 0.0  . Smokeless tobacco: Never Used  Substance and Sexual Activity  . Alcohol use: Yes    Alcohol/week: 0.0 oz  . Drug use: No  . Sexual activity: Yes    Partners: Female  Lifestyle  . Physical activity:    Days per week: Not on file    Minutes per session: Not on file  . Stress: Not on file  Relationships  . Social connections:    Talks on phone: Not on file    Gets together: Not on file    Attends religious service: Not on file    Active member of club or organization: Not on file    Attends meetings of clubs or organizations: Not on file    Relationship status: Not on file  . Intimate partner violence:    Fear of current or ex  partner: Not on file    Emotionally abused: Not on file    Physically abused: Not on file    Forced sexual activity: Not on file  Other Topics Concern  . Not on file  Social History Narrative   No regular exercise.      Allergies: Cymbalta [duloxetine hcl] and Klonopin [clonazepam]  Medications: I have reviewed the patient's current medications.  Vital Signs: No data found.  Radiology: No results found.  Labs: No results for input(s): WBC, RBC, HCT, PLT in the last 72 hours. No results for input(s): NA, K, CL, CO2, BUN, CREATININE, GLUCOSE, CALCIUM in the last 72 hours. No results for input(s): LABPT, INR in the last 72 hours.  Review of Systems: ROS  Physical Exam: There is no height or weight on file to calculate BMI.  Physical Exam  Constitutional: He is oriented to person, place, and time. He appears well-developed and well-nourished.  HENT:  Head: Normocephalic.  Eyes: Pupils are equal, round, and reactive to light.  Neck: Normal range of motion.  Cardiovascular: Normal rate and regular rhythm.  Respiratory: Effort normal and breath sounds normal.  GI: Soft. Bowel sounds are normal.  Neurological: He is alert and oriented to person, place, and time. He has normal reflexes.  Skin: Skin is warm and dry.  Psychiatric: He has a normal mood and affect. His behavior is normal. Judgment and thought content normal.   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   Assessment and Plan: Risks and benefits of surgery were discussed with the patient.  These include: Infection, bleeding, death, stroke, paralysis, ongoing or worse pain, need for additional surgery, nonunion, leak of spinal fluid, adjacent segment  degeneration requiring additional fusion surgery. Pseudoarthrosis (nonunion)requiring supplemental posterior fixation.  Throat pain, swallowing difficulties, hoarseness or change in voice.   With respect to disc replacement: Additional risks include heterotopic ossification, inability to place the disc due to technical issues requiring bailout to a fusion procedure.  Ronette Deter, PAC for Melina Schools, [place] [phone]

## 2018-03-17 ENCOUNTER — Encounter (HOSPITAL_COMMUNITY): Payer: Self-pay

## 2018-03-17 NOTE — Pre-Procedure Instructions (Signed)
DEANE WATTENBARGER  03/17/2018      [id number] - [place], [place] - [address] [place] [address] Phone: [phone] Fax: [phone]    Your procedure is scheduled on 03/23/2018.  Report to [facility] Admitting at 0830 A.M.  Call this number if you have problems the morning of surgery:  [phone]   Remember:  Do not eat food or drink liquids after midnight.   Continue all medications as directed by your physician except follow these medication instructions before surgery below   Take these medicines the morning of surgery with A SIP OF WATER: Baclofen (Lioresal) Diazepam (Valium) - if needed Gabapentin (Neurontin) - if needed Lamotrigine XR Propranolol ER (Inderal LA)  7 days prior to surgery STOP taking any Aspirin(unless otherwise instructed by your surgeon), Aleve, Naproxen, Ibuprofen, Motrin, Advil, Goody's, BC's, all herbal medications, fish oil, and all vitamins     Do not wear jewelry  Do not wear lotions, powders, or colognes, or deodorant.  Men may shave face and neck.  Do not bring valuables to the hospital.  [facility] is not responsible for any belongings or valuables.  Hearing aids, eyeglasses, contacts, dentures or bridgework may not be worn into surgery.  Leave your suitcase in the car.  After surgery it may be brought to your room.  For patients admitted to the hospital, discharge time will be determined by your treatment team.  Patients discharged the day of surgery will not be allowed to drive home.   Name and phone number of your driver:    Special instructions:   [facility]- Preparing For Surgery  Before surgery, you can play an important role. Because skin is not sterile, your skin needs to be as free of germs as possible. You can reduce the number of germs on your skin by washing with CHG (chlorahexidine gluconate) Soap before surgery.  CHG is an antiseptic cleaner which kills germs and bonds with  the skin to continue killing germs even after washing.  Please do not use if you have an allergy to CHG or antibacterial soaps. If your skin becomes reddened/irritated stop using the CHG.  Do not shave (including legs and underarms) for at least 48 hours prior to first CHG shower. It is OK to shave your face.  Please follow these instructions carefully.   1. Shower the NIGHT BEFORE SURGERY and the MORNING OF SURGERY with CHG.   2. If you chose to wash your hair, wash your hair first as usual with your normal shampoo.  3. After you shampoo, rinse your hair and body thoroughly to remove the shampoo.  4. Use CHG as you would any other liquid soap. You can apply CHG directly to the skin and wash gently with a scrungie or a clean washcloth.   5. Apply the CHG Soap to your body ONLY FROM THE NECK DOWN.  Do not use on open wounds or open sores. Avoid contact with your eyes, ears, mouth and genitals (private parts). Wash Face and genitals (private parts)  with your normal soap.  6. Wash thoroughly, paying special attention to the area where your surgery will be performed.  7. Thoroughly rinse your body with warm water from the neck down.  8. DO NOT shower/wash with your normal soap after using and rinsing off the CHG Soap.  9. Pat yourself dry with a CLEAN TOWEL.  10. Wear CLEAN PAJAMAS to bed the night before  surgery, wear comfortable clothes the morning of surgery  11. Place CLEAN SHEETS on your bed the night of your first shower and DO NOT SLEEP WITH PETS.    Day of Surgery: Shower as stated above. Do not apply any deodorants/lotions. Please wear clean clothes to the hospital/surgery center.      Please read over the following fact sheets that you were given.

## 2018-03-20 ENCOUNTER — Encounter (HOSPITAL_COMMUNITY): Payer: Self-pay

## 2018-03-20 ENCOUNTER — Other Ambulatory Visit: Payer: Self-pay

## 2018-03-20 ENCOUNTER — Encounter (HOSPITAL_COMMUNITY)
Admission: RE | Admit: 2018-03-20 | Discharge: 2018-03-20 | Disposition: A | Discharge status: Home / Self Care | Payer: Medicare Other | Source: Ambulatory Visit | Attending: Orthopedic Surgery | Admitting: Orthopedic Surgery

## 2018-03-20 DIAGNOSIS — I10 Essential (primary) hypertension: Secondary | ICD-10-CM | POA: Diagnosis not present

## 2018-03-20 DIAGNOSIS — R482 Apraxia: Secondary | ICD-10-CM | POA: Diagnosis not present

## 2018-03-20 DIAGNOSIS — E785 Hyperlipidemia, unspecified: Secondary | ICD-10-CM | POA: Diagnosis not present

## 2018-03-20 DIAGNOSIS — Z01812 Encounter for preprocedural laboratory examination: Secondary | ICD-10-CM | POA: Diagnosis not present

## 2018-03-20 HISTORY — DX: Anxiety disorder, unspecified: F41.9

## 2018-03-20 HISTORY — DX: Other specified congenital malformations of circulatory system: Q28.8

## 2018-03-20 HISTORY — DX: Unspecified osteoarthritis, unspecified site: M19.90

## 2018-03-20 HISTORY — DX: Hyperlipidemia, unspecified: E78.5

## 2018-03-20 HISTORY — DX: Headache, unspecified: R51.9

## 2018-03-20 HISTORY — DX: Headache: R51

## 2018-03-20 LAB — CBC
HEMATOCRIT: 45.3 % (ref 39.0–52.0)
HEMOGLOBIN: 15.8 g/dL (ref 13.0–17.0)
MCH: 32.6 pg (ref 26.0–34.0)
MCHC: 34.9 g/dL (ref 30.0–36.0)
MCV: 93.6 fL (ref 78.0–100.0)
PLATELETS: 270 10*3/uL (ref 150–400)
RBC: 4.84 MIL/uL (ref 4.22–5.81)
RDW: 14.2 % (ref 11.5–15.5)
WBC: 10.7 10*3/uL — AB (ref 4.0–10.5)

## 2018-03-20 LAB — BASIC METABOLIC PANEL
ANION GAP: 13 (ref 5–15)
BUN: 21 mg/dL — ABNORMAL HIGH (ref 6–20)
CHLORIDE: 96 mmol/L — AB (ref 101–111)
CO2: 25 mmol/L (ref 22–32)
Calcium: 10.3 mg/dL (ref 8.9–10.3)
Creatinine, Ser: 1.15 mg/dL (ref 0.61–1.24)
GFR calc non Af Amer: >60 mL/min (ref >60)
Glucose, Bld: 95 mg/dL (ref 65–99)
POTASSIUM: 4.2 mmol/L (ref 3.5–5.1)
Sodium: 134 mmol/L — ABNORMAL LOW (ref 135–145)

## 2018-03-20 LAB — TYPE AND SCREEN
ABO/RH(D): A POS
Antibody Screen: NEGATIVE

## 2018-03-20 LAB — ABO/RH: ABO/RH(D): A POS

## 2018-03-20 LAB — SURGICAL PCR SCREEN
MRSA, PCR: NEGATIVE
Staphylococcus aureus: NEGATIVE

## 2018-03-20 NOTE — Progress Notes (Signed)
PCP - Dr. Beatrice Lecher Cardiologist - patient denies  Chest x-ray - 04/11/2017 EKG - 04/11/2017; requested tracing from [facility], will do DOS if tracing not received Stress Test - patient denies ECHO - patient denies Cardiac Cath - patient denies  Sleep Study - patient denies  Aspirin Instructions: patient's last dose 03/15/2018  Anesthesia review: yes, per Dr. Rolena Infante order  Patient denies shortness of breath, fever, cough and chest pain at PAT appointment   Patient verbalized understanding of instructions that were given to them at the PAT appointment. Patient was also instructed that they will need to review over the PAT instructions again at home before surgery.

## 2018-03-21 NOTE — Progress Notes (Signed)
Anesthesia Chart Review:  Pt is a 57 year old male scheduled for C4-6 ACDF on 03/23/2018 with Melina Schools, MD  - PCP is Beatrice Lecher, MD  - Neurologist is Gayla Medicus, MD who has followed pt for episodes described as possible seizures or syncope (falling, gait disturbance, spasticity, poor balance, and memory disturbance, now thought to be due to cervical stenoses and the reason for scheduled surgery (notes in care everywhere)   PMH includes:  HTN, hyperlipidemia, AV fistula of spinal cord vessels (I think this was suspected, but not identified on imaging- see care everywhere), closed head injury (2013), 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 Former smoker (quit 02/11/18). BMI 22  - ED visit 04/11/17 for syncope (notes in care everywhere).   Medications include: ASA 325mg , losartan-hctz, pravastatin, propranolol  BP 105/78   Pulse (!) 57   Temp 36.7 C   Resp 18   Ht 5\' 5"  (1.651 m)   Wt 131 lb 9.6 oz (59.7 kg)   SpO2 95%   BMI 21.90 kg/m   Preoperative labs reviewed.    EKG 04/11/17 ([facility]): NSR. Anterior infarct, age undetermined  MRI cervical spine 11/30/17 (care everywhere):  1.Broad-based central disc osteophyte complex C5-6 with mild compression of the cervical spinal cord. There is abnormal signal intensity in the spinal cord just above this level consistent with spinal cord edema or myelomalacia. There is also moderate  bilateral foraminal stenosis at this level. 2. Severe bilateral foraminal stenoses C6-7.  3. Moderate foraminal stenoses on the left at C3-4 and C4-5.  Cervical Spinal angiogram 07/28/17 (care everywhere):  - Cervical spinal arteriogram negative for dural fistula  Thoracic/lumbar Spinal angiogram 07/01/17:  - The arteries were examined from T5 through L4 on both sides. The left internal iliac was injected. The right internal iliac is occluded. A descending thoracic aortogram was performed. - There is a  normal anterior spinal artery on the left T12. - No fistula identified.  If no changes, I anticipate pt can proceed with surgery as scheduled.   Willeen Cass, FNP-BC [facility] Short Stay Surgical Center/Anesthesiology Phone: [phone] 03/21/2018 10:43 AM

## 2018-03-22 ENCOUNTER — Encounter (HOSPITAL_COMMUNITY): Payer: Self-pay | Admitting: Anesthesiology

## 2018-03-23 ENCOUNTER — Encounter (HOSPITAL_COMMUNITY): Payer: Self-pay | Admitting: *Deleted

## 2018-03-23 ENCOUNTER — Other Ambulatory Visit: Payer: Self-pay

## 2018-03-23 ENCOUNTER — Ambulatory Visit (HOSPITAL_COMMUNITY): Payer: Worker's Compensation

## 2018-03-23 ENCOUNTER — Inpatient Hospital Stay (HOSPITAL_COMMUNITY)
Admission: RE | Admit: 2018-03-23 | Discharge: 2018-03-28 | DRG: 473 | Disposition: A | Discharge status: Skilled Nursing Facility | Payer: Worker's Compensation | Source: Ambulatory Visit | Attending: Orthopedic Surgery | Admitting: Orthopedic Surgery

## 2018-03-23 ENCOUNTER — Inpatient Hospital Stay (HOSPITAL_COMMUNITY)
Admission: RE | Disposition: A | Discharge status: Skilled Nursing Facility | Payer: Self-pay | Source: Ambulatory Visit | Attending: Orthopedic Surgery

## 2018-03-23 ENCOUNTER — Ambulatory Visit (HOSPITAL_COMMUNITY): Payer: Worker's Compensation | Admitting: Anesthesiology

## 2018-03-23 ENCOUNTER — Ambulatory Visit (HOSPITAL_COMMUNITY): Payer: Worker's Compensation | Admitting: Vascular Surgery

## 2018-03-23 DIAGNOSIS — Z823 Family history of stroke: Secondary | ICD-10-CM

## 2018-03-23 DIAGNOSIS — J449 Chronic obstructive pulmonary disease, unspecified: Secondary | ICD-10-CM | POA: Diagnosis present

## 2018-03-23 DIAGNOSIS — Z8249 Family history of ischemic heart disease and other diseases of the circulatory system: Secondary | ICD-10-CM

## 2018-03-23 DIAGNOSIS — Z419 Encounter for procedure for purposes other than remedying health state, unspecified: Secondary | ICD-10-CM

## 2018-03-23 DIAGNOSIS — Z809 Family history of malignant neoplasm, unspecified: Secondary | ICD-10-CM

## 2018-03-23 DIAGNOSIS — G959 Disease of spinal cord, unspecified: Secondary | ICD-10-CM | POA: Diagnosis present

## 2018-03-23 DIAGNOSIS — I10 Essential (primary) hypertension: Secondary | ICD-10-CM | POA: Diagnosis present

## 2018-03-23 DIAGNOSIS — Z888 Allergy status to other drugs, medicaments and biological substances status: Secondary | ICD-10-CM

## 2018-03-23 DIAGNOSIS — E785 Hyperlipidemia, unspecified: Secondary | ICD-10-CM | POA: Diagnosis present

## 2018-03-23 DIAGNOSIS — M4802 Spinal stenosis, cervical region: Secondary | ICD-10-CM | POA: Diagnosis not present

## 2018-03-23 DIAGNOSIS — M4712 Other spondylosis with myelopathy, cervical region: Principal | ICD-10-CM | POA: Diagnosis present

## 2018-03-23 HISTORY — PX: ANTERIOR CERVICAL DECOMP/DISCECTOMY FUSION: SHX1161

## 2018-03-23 SURGERY — ANTERIOR CERVICAL DECOMPRESSION/DISCECTOMY FUSION 2 LEVELS
Anesthesia: General | Site: Spine Cervical

## 2018-03-23 MED ORDER — METHOCARBAMOL 500 MG PO TABS
500.0000 mg | ORAL_TABLET | Freq: Four times a day (QID) | ORAL | Status: DC | PRN
  Administered 2018-03-23 – 2018-03-24 (×3): 500 mg via ORAL
  Filled 2018-03-23 (×3): qty 1

## 2018-03-23 MED ORDER — FENTANYL CITRATE (PF) 250 MCG/5ML IJ SOLN
INTRAMUSCULAR | Status: AC
  Filled 2018-03-23: qty 5

## 2018-03-23 MED ORDER — OXYCODONE HCL 5 MG PO TABS
10.0000 mg | ORAL_TABLET | ORAL | Status: DC | PRN
  Administered 2018-03-23 – 2018-03-25 (×4): 10 mg via ORAL
  Filled 2018-03-23 (×6): qty 2

## 2018-03-23 MED ORDER — OXYCODONE-ACETAMINOPHEN 10-325 MG PO TABS: 1.0000 | ORAL_TABLET | Freq: Four times a day (QID) | ORAL | 0 refills | Status: DC | PRN

## 2018-03-23 MED ORDER — MIDAZOLAM HCL 2 MG/2ML IJ SOLN
INTRAMUSCULAR | Status: DC | PRN
  Administered 2018-03-23: 2 mg via INTRAVENOUS

## 2018-03-23 MED ORDER — FENTANYL CITRATE (PF) 100 MCG/2ML IJ SOLN
INTRAMUSCULAR | Status: DC | PRN
  Administered 2018-03-23: 50 ug via INTRAVENOUS
  Administered 2018-03-23: 100 ug via INTRAVENOUS
  Administered 2018-03-23 (×2): 50 ug via INTRAVENOUS

## 2018-03-23 MED ORDER — ONDANSETRON HCL 4 MG/2ML IJ SOLN
INTRAMUSCULAR | Status: AC
  Filled 2018-03-23: qty 2

## 2018-03-23 MED ORDER — GABAPENTIN 400 MG PO CAPS
1200.0000 mg | ORAL_CAPSULE | Freq: Two times a day (BID) | ORAL | Status: DC
  Administered 2018-03-23 – 2018-03-28 (×10): 1200 mg via ORAL
  Filled 2018-03-23 (×10): qty 3

## 2018-03-23 MED ORDER — BUPIVACAINE-EPINEPHRINE 0.25% -1:200000 IJ SOLN
INTRAMUSCULAR | Status: DC | PRN
  Administered 2018-03-23: 10 mL

## 2018-03-23 MED ORDER — METHOCARBAMOL 500 MG PO TABS: 500.0000 mg | ORAL_TABLET | Freq: Three times a day (TID) | ORAL | 0 refills | Status: DC | PRN

## 2018-03-23 MED ORDER — POLYETHYLENE GLYCOL 3350 17 G PO PACK: 17.0000 g | PACK | Freq: Every day | ORAL | Status: DC | PRN

## 2018-03-23 MED ORDER — DEXAMETHASONE SODIUM PHOSPHATE 4 MG/ML IJ SOLN: 4.0000 mg | Freq: Four times a day (QID) | INTRAMUSCULAR | Status: AC

## 2018-03-23 MED ORDER — ACETAMINOPHEN 10 MG/ML IV SOLN
INTRAVENOUS | Status: AC
  Filled 2018-03-23: qty 100

## 2018-03-23 MED ORDER — HYDROCHLOROTHIAZIDE 25 MG PO TABS
25.0000 mg | ORAL_TABLET | Freq: Every day | ORAL | Status: DC
  Administered 2018-03-23 – 2018-03-28 (×6): 25 mg via ORAL
  Filled 2018-03-23 (×6): qty 1

## 2018-03-23 MED ORDER — LOSARTAN POTASSIUM 50 MG PO TABS
100.0000 mg | ORAL_TABLET | Freq: Every day | ORAL | Status: DC
  Administered 2018-03-23 – 2018-03-28 (×6): 100 mg via ORAL
  Filled 2018-03-23 (×6): qty 2

## 2018-03-23 MED ORDER — ACETAMINOPHEN 10 MG/ML IV SOLN
INTRAVENOUS | Status: DC | PRN
  Administered 2018-03-23: 1000 mg via INTRAVENOUS

## 2018-03-23 MED ORDER — PRAVASTATIN SODIUM 20 MG PO TABS
20.0000 mg | ORAL_TABLET | Freq: Every day | ORAL | Status: DC
  Administered 2018-03-23 – 2018-03-28 (×6): 20 mg via ORAL
  Filled 2018-03-23 (×6): qty 1

## 2018-03-23 MED ORDER — PHENYLEPHRINE HCL 10 MG/ML IJ SOLN
INTRAMUSCULAR | Status: DC | PRN
  Administered 2018-03-23: 35 ug/min via INTRAVENOUS

## 2018-03-23 MED ORDER — THROMBIN (RECOMBINANT) 20000 UNITS EX SOLR
CUTANEOUS | Status: DC | PRN
  Administered 2018-03-23: 12:00:00 via TOPICAL

## 2018-03-23 MED ORDER — LACTATED RINGERS IV SOLN: INTRAVENOUS | Status: DC

## 2018-03-23 MED ORDER — PROPOFOL 500 MG/50ML IV EMUL
INTRAVENOUS | Status: DC | PRN
  Administered 2018-03-23: 75 ug/kg/min via INTRAVENOUS

## 2018-03-23 MED ORDER — OXYCODONE HCL 5 MG PO TABS: 5.0000 mg | ORAL_TABLET | Freq: Once | ORAL | Status: DC | PRN

## 2018-03-23 MED ORDER — SODIUM CHLORIDE 0.9% FLUSH: 3.0000 mL | INTRAVENOUS | Status: DC | PRN

## 2018-03-23 MED ORDER — ONDANSETRON HCL 4 MG/2ML IJ SOLN: 4.0000 mg | Freq: Four times a day (QID) | INTRAMUSCULAR | Status: DC | PRN

## 2018-03-23 MED ORDER — LAMOTRIGINE 100 MG PO TABS
200.0000 mg | ORAL_TABLET | Freq: Two times a day (BID) | ORAL | Status: DC
  Administered 2018-03-23 – 2018-03-28 (×10): 200 mg via ORAL
  Filled 2018-03-23 (×10): qty 2

## 2018-03-23 MED ORDER — HYDROMORPHONE HCL 1 MG/ML IJ SOLN: 0.2500 mg | INTRAMUSCULAR | Status: DC | PRN

## 2018-03-23 MED ORDER — LIDOCAINE 2% (20 MG/ML) 5 ML SYRINGE
INTRAMUSCULAR | Status: DC | PRN
  Administered 2018-03-23: 100 mg via INTRAVENOUS

## 2018-03-23 MED ORDER — METHOCARBAMOL 1000 MG/10ML IJ SOLN
500.0000 mg | Freq: Four times a day (QID) | INTRAVENOUS | Status: DC | PRN
  Filled 2018-03-23: qty 5

## 2018-03-23 MED ORDER — PROPRANOLOL HCL ER 80 MG PO CP24
80.0000 mg | ORAL_CAPSULE | Freq: Every day | ORAL | Status: DC
  Administered 2018-03-24 – 2018-03-28 (×5): 80 mg via ORAL
  Filled 2018-03-23 (×5): qty 1

## 2018-03-23 MED ORDER — SUCCINYLCHOLINE CHLORIDE 200 MG/10ML IV SOSY
PREFILLED_SYRINGE | INTRAVENOUS | Status: DC | PRN
  Administered 2018-03-23: 100 mg via INTRAVENOUS

## 2018-03-23 MED ORDER — MORPHINE SULFATE (PF) 4 MG/ML IV SOLN: 2.0000 mg | INTRAVENOUS | Status: DC | PRN

## 2018-03-23 MED ORDER — PROPOFOL 10 MG/ML IV BOLUS
INTRAVENOUS | Status: DC | PRN
  Administered 2018-03-23: 150 mg via INTRAVENOUS

## 2018-03-23 MED ORDER — SODIUM CHLORIDE 0.9 % IV SOLN: 250.0000 mL | INTRAVENOUS | Status: DC

## 2018-03-23 MED ORDER — LIDOCAINE 2% (20 MG/ML) 5 ML SYRINGE
INTRAMUSCULAR | Status: AC
  Filled 2018-03-23: qty 5

## 2018-03-23 MED ORDER — OXYCODONE HCL 5 MG/5ML PO SOLN: 5.0000 mg | Freq: Once | ORAL | Status: DC | PRN

## 2018-03-23 MED ORDER — MENTHOL 3 MG MT LOZG: 1.0000 | LOZENGE | OROMUCOSAL | Status: DC | PRN

## 2018-03-23 MED ORDER — 0.9 % SODIUM CHLORIDE (POUR BTL) OPTIME
TOPICAL | Status: DC | PRN
  Administered 2018-03-23: 1000 mL

## 2018-03-23 MED ORDER — ONDANSETRON HCL 4 MG PO TABS: 4.0000 mg | ORAL_TABLET | Freq: Three times a day (TID) | ORAL | 0 refills | Status: DC | PRN

## 2018-03-23 MED ORDER — SODIUM CHLORIDE 0.9% FLUSH
3.0000 mL | Freq: Two times a day (BID) | INTRAVENOUS | Status: DC
  Administered 2018-03-23 – 2018-03-27 (×2): 3 mL via INTRAVENOUS

## 2018-03-23 MED ORDER — PHENOL 1.4 % MT LIQD: 1.0000 | OROMUCOSAL | Status: DC | PRN

## 2018-03-23 MED ORDER — CEFAZOLIN SODIUM-DEXTROSE 2-4 GM/100ML-% IV SOLN
2.0000 g | Freq: Three times a day (TID) | INTRAVENOUS | Status: AC
  Administered 2018-03-23 – 2018-03-24 (×2): 2 g via INTRAVENOUS
  Filled 2018-03-23 (×2): qty 100

## 2018-03-23 MED ORDER — ACETAMINOPHEN 650 MG RE SUPP: 650.0000 mg | RECTAL | Status: DC | PRN

## 2018-03-23 MED ORDER — DEXAMETHASONE SODIUM PHOSPHATE 10 MG/ML IJ SOLN
INTRAMUSCULAR | Status: DC | PRN
  Administered 2018-03-23: 10 mg via INTRAVENOUS

## 2018-03-23 MED ORDER — LACTATED RINGERS IV SOLN
INTRAVENOUS | Status: DC
  Administered 2018-03-23 (×3): via INTRAVENOUS

## 2018-03-23 MED ORDER — DEXAMETHASONE 4 MG PO TABS
4.0000 mg | ORAL_TABLET | Freq: Four times a day (QID) | ORAL | Status: AC
  Administered 2018-03-23 – 2018-03-24 (×3): 4 mg via ORAL
  Filled 2018-03-23 (×3): qty 1

## 2018-03-23 MED ORDER — OXYCODONE HCL 5 MG PO TABS
5.0000 mg | ORAL_TABLET | ORAL | Status: DC | PRN
  Administered 2018-03-23 – 2018-03-25 (×5): 5 mg via ORAL
  Filled 2018-03-23 (×4): qty 1

## 2018-03-23 MED ORDER — SUGAMMADEX SODIUM 200 MG/2ML IV SOLN
INTRAVENOUS | Status: AC
  Filled 2018-03-23: qty 2

## 2018-03-23 MED ORDER — MIDAZOLAM HCL 2 MG/2ML IJ SOLN
INTRAMUSCULAR | Status: AC
  Filled 2018-03-23: qty 2

## 2018-03-23 MED ORDER — LOSARTAN POTASSIUM-HCTZ 100-25 MG PO TABS: 1.0000 | ORAL_TABLET | Freq: Every day | ORAL | Status: DC

## 2018-03-23 MED ORDER — BUPIVACAINE-EPINEPHRINE 0.25% -1:200000 IJ SOLN
INTRAMUSCULAR | Status: AC
  Filled 2018-03-23: qty 1

## 2018-03-23 MED ORDER — PROPOFOL 10 MG/ML IV BOLUS
INTRAVENOUS | Status: AC
  Filled 2018-03-23: qty 20

## 2018-03-23 MED ORDER — ROCURONIUM BROMIDE 10 MG/ML (PF) SYRINGE
PREFILLED_SYRINGE | INTRAVENOUS | Status: AC
  Filled 2018-03-23: qty 5

## 2018-03-23 MED ORDER — ACETAMINOPHEN 325 MG PO TABS
650.0000 mg | ORAL_TABLET | ORAL | Status: DC | PRN
  Administered 2018-03-24: 650 mg via ORAL
  Filled 2018-03-23: qty 2

## 2018-03-23 MED ORDER — ONDANSETRON HCL 4 MG PO TABS: 4.0000 mg | ORAL_TABLET | Freq: Four times a day (QID) | ORAL | Status: DC | PRN

## 2018-03-23 MED ORDER — CEFAZOLIN SODIUM-DEXTROSE 2-4 GM/100ML-% IV SOLN
2.0000 g | INTRAVENOUS | Status: AC
  Administered 2018-03-23: 2 g via INTRAVENOUS
  Filled 2018-03-23: qty 100

## 2018-03-23 MED ORDER — THROMBIN 20000 UNITS EX SOLR
CUTANEOUS | Status: AC
  Filled 2018-03-23: qty 20000

## 2018-03-23 MED ORDER — DEXAMETHASONE SODIUM PHOSPHATE 10 MG/ML IJ SOLN
INTRAMUSCULAR | Status: AC
  Filled 2018-03-23: qty 1

## 2018-03-23 SURGICAL SUPPLY — 75 items
BIT DRILL SKYLINE 12MM (BIT) ×1 IMPLANT
BLADE CLIPPER SURG (BLADE) ×3 IMPLANT
BONE VIVIGEN FORMABLE 1.3CC (Bone Implant) ×3 IMPLANT
BUR EGG ELITE 4.0 (BURR) ×2 IMPLANT
BUR EGG ELITE 4.0MM (BURR) ×1
BUR MATCHSTICK NEURO 3.0 LAGG (BURR) ×3 IMPLANT
CANISTER SUCT 3000ML PPV (MISCELLANEOUS) ×3 IMPLANT
CLOSURE STERI-STRIP 1/2X4 (GAUZE/BANDAGES/DRESSINGS) ×1
CLOSURE WOUND 1/2 X4 (GAUZE/BANDAGES/DRESSINGS) ×1
CLSR STERI-STRIP ANTIMIC 1/2X4 (GAUZE/BANDAGES/DRESSINGS) ×2 IMPLANT
COVER MAYO STAND STRL (DRAPES) ×9 IMPLANT
CRADLE DONUT ADULT HEAD (MISCELLANEOUS) ×3 IMPLANT
DEVICE ENDSKLTN IMPLANT SM 7MM (Cage) ×2 IMPLANT
DRAPE C-ARM 42X72 X-RAY (DRAPES) ×3 IMPLANT
DRAPE MICROSCOPE LEICA (MISCELLANEOUS) ×3 IMPLANT
DRAPE POUCH INSTRU U-SHP 10X18 (DRAPES) ×3 IMPLANT
DRAPE SURG 17X23 STRL (DRAPES) ×3 IMPLANT
DRAPE U-SHAPE 47X51 STRL (DRAPES) ×3 IMPLANT
DRILL BIT SKYLINE 12MM (BIT) ×2
DRSG OPSITE POSTOP 3X4 (GAUZE/BANDAGES/DRESSINGS) ×3 IMPLANT
DRSG OPSITE POSTOP 4X6 (GAUZE/BANDAGES/DRESSINGS) ×3 IMPLANT
DURAPREP 26ML APPLICATOR (WOUND CARE) ×3 IMPLANT
ELECT PENCIL ROCKER SW 15FT (MISCELLANEOUS) ×3 IMPLANT
ELECT REM PT RETURN 9FT ADLT (ELECTROSURGICAL) ×3
ELECTRODE REM PT RTRN 9FT ADLT (ELECTROSURGICAL) ×1 IMPLANT
ENDOSKELETON IMPLANT SM 7MM (Cage) ×6 IMPLANT
FEE INTRAOP MONITOR IMPULS NCS (MISCELLANEOUS) ×1 IMPLANT
FLOSEAL 10ML (HEMOSTASIS) ×3 IMPLANT
GLOVE BIO SURGEON STRL SZ 6.5 (GLOVE) ×2 IMPLANT
GLOVE BIO SURGEONS STRL SZ 6.5 (GLOVE) ×1
GLOVE BIOGEL PI IND STRL 6.5 (GLOVE) ×1 IMPLANT
GLOVE BIOGEL PI IND STRL 8.5 (GLOVE) ×1 IMPLANT
GLOVE BIOGEL PI INDICATOR 6.5 (GLOVE) ×2
GLOVE BIOGEL PI INDICATOR 8.5 (GLOVE) ×2
GLOVE SS BIOGEL STRL SZ 8.5 (GLOVE) ×1 IMPLANT
GLOVE SUPERSENSE BIOGEL SZ 8.5 (GLOVE) ×2
GOWN STRL REUS W/ TWL LRG LVL3 (GOWN DISPOSABLE) ×1 IMPLANT
GOWN STRL REUS W/TWL 2XL LVL3 (GOWN DISPOSABLE) ×3 IMPLANT
GOWN STRL REUS W/TWL LRG LVL3 (GOWN DISPOSABLE) ×2
INTRAOP MONITOR FEE IMPULS NCS (MISCELLANEOUS) ×1
INTRAOP MONITOR FEE IMPULSE (MISCELLANEOUS) ×2
KIT BASIN OR (CUSTOM PROCEDURE TRAY) ×3 IMPLANT
KIT TURNOVER KIT B (KITS) ×3 IMPLANT
NEEDLE HYPO 22GX1.5 SAFETY (NEEDLE) ×3 IMPLANT
NEEDLE SPNL 18GX3.5 QUINCKE PK (NEEDLE) ×3 IMPLANT
NS IRRIG 1000ML POUR BTL (IV SOLUTION) ×3 IMPLANT
PACK ORTHO CERVICAL (CUSTOM PROCEDURE TRAY) ×3 IMPLANT
PACK UNIVERSAL I (CUSTOM PROCEDURE TRAY) ×3 IMPLANT
PAD ARMBOARD 7.5X6 YLW CONV (MISCELLANEOUS) ×6 IMPLANT
PATTIES SURGICAL .25X.25 (GAUZE/BANDAGES/DRESSINGS) ×3 IMPLANT
PIN DISTRACTION 14 (PIN) ×6 IMPLANT
PLATE TWO LEVEL SKYLINE 30MM (Plate) ×3 IMPLANT
RESTRAINT LIMB HOLDER UNIV (RESTRAINTS) ×3 IMPLANT
RUBBERBAND STERILE (MISCELLANEOUS) ×6 IMPLANT
SCREW SELF DRILL SKYLINE 12MM (Screw) ×6 IMPLANT
SCREW SKYLINE 14MM SD-VA (Screw) ×6 IMPLANT
SCREW SKYLINE 16MM (Screw) ×6 IMPLANT
SPONGE INTESTINAL PEANUT (DISPOSABLE) ×3 IMPLANT
SPONGE LAP 4X18 X RAY DECT (DISPOSABLE) ×6 IMPLANT
SPONGE SURGIFOAM ABS GEL 100 (HEMOSTASIS) ×3 IMPLANT
STRIP CLOSURE SKIN 1/2X4 (GAUZE/BANDAGES/DRESSINGS) ×2 IMPLANT
SUT BONE WAX W31G (SUTURE) ×3 IMPLANT
SUT MON AB 3-0 SH 27 (SUTURE) ×2
SUT MON AB 3-0 SH27 (SUTURE) ×1 IMPLANT
SUT SILK 2 0 (SUTURE)
SUT SILK 2-0 18XBRD TIE 12 (SUTURE) IMPLANT
SUT VIC AB 2-0 CT1 18 (SUTURE) ×3 IMPLANT
SYR BULB IRRIGATION 50ML (SYRINGE) ×3 IMPLANT
SYR CONTROL 10ML LL (SYRINGE) ×3 IMPLANT
TAPE CLOTH 4X10 WHT NS (GAUZE/BANDAGES/DRESSINGS) ×3 IMPLANT
TAPE UMBILICAL COTTON 1/8X30 (MISCELLANEOUS) ×3 IMPLANT
TOWEL GREEN STERILE (TOWEL DISPOSABLE) ×3 IMPLANT
TOWEL GREEN STERILE FF (TOWEL DISPOSABLE) ×3 IMPLANT
TRAY FOLEY W/METER SILVER 16FR (SET/KITS/TRAYS/PACK) ×3 IMPLANT
WATER STERILE IRR 1000ML POUR (IV SOLUTION) ×3 IMPLANT

## 2018-03-23 NOTE — Anesthesia Procedure Notes (Signed)
Procedure Name: Intubation Date/Time: 03/23/2018 10:27 AM Performed by: Leonor Liv, CRNA Pre-anesthesia Checklist: Patient identified, Emergency Drugs available, Suction available and Patient being monitored Patient Re-evaluated:Patient Re-evaluated prior to induction Oxygen Delivery Method: Circle System Utilized Preoxygenation: Pre-oxygenation with 100% oxygen Induction Type: IV induction Ventilation: Mask ventilation without difficulty Laryngoscope Size: Glidescope and 4 Grade View: Grade I Tube type: Oral Tube size: 7.5 mm Number of attempts: 1 Airway Equipment and Method: Oral airway,  LTA kit utilized,  Rigid stylet and Video-laryngoscopy Placement Confirmation: ETT inserted through vocal cords under direct vision,  positive ETCO2 and breath sounds checked- equal and bilateral Secured at: 22 cm Tube secured with: Tape Dental Injury: Teeth and Oropharynx as per pre-operative assessment

## 2018-03-23 NOTE — Plan of Care (Signed)
  Problem: Education: Goal: Knowledge of General Education information will improve Outcome: Progressing   Problem: Health Behavior/Discharge Planning: Goal: Ability to manage health-related needs will improve Outcome: Progressing   Problem: Clinical Measurements: Goal: Ability to maintain clinical measurements within normal limits will improve Outcome: Progressing Goal: Will remain free from infection Outcome: Progressing Goal: Diagnostic test results will improve Outcome: Progressing Goal: Respiratory complications will improve Outcome: Progressing Goal: Cardiovascular complication will be avoided Outcome: Progressing   Problem: Activity: Goal: Risk for activity intolerance will decrease Outcome: Progressing   Problem: Nutrition: Goal: Adequate nutrition will be maintained Outcome: Progressing   Problem: Coping: Goal: Level of anxiety will decrease Outcome: Progressing   Problem: Elimination: Goal: Will not experience complications related to bowel motility Outcome: Progressing Goal: Will not experience complications related to urinary retention Outcome: Progressing   Problem: Pain Managment: Goal: General experience of comfort will improve Outcome: Progressing   Problem: Safety: Goal: Ability to remain free from injury will improve Outcome: Progressing   Problem: Skin Integrity: Goal: Risk for impaired skin integrity will decrease Outcome: Progressing   Problem: Activity: Goal: Ability to avoid complications of mobility impairment will improve Outcome: Progressing Goal: Ability to tolerate increased activity will improve Outcome: Progressing Goal: Will remain free from falls Outcome: Progressing   Problem: Bowel/Gastric: Goal: Gastrointestinal status for postoperative course will improve Outcome: Progressing   Problem: Education: Goal: Ability to verbalize activity precautions or restrictions will improve Outcome: Progressing Goal: Knowledge of the  prescribed therapeutic regimen will improve Outcome: Progressing Goal: Understanding of discharge needs will improve Outcome: Progressing   Problem: Physical Regulation: Goal: Ability to maintain clinical measurements within normal limits will improve Outcome: Progressing Goal: Postoperative complications will be avoided or minimized Outcome: Progressing Goal: Diagnostic test results will improve Outcome: Progressing   Problem: Pain Management: Goal: Pain level will decrease Outcome: Progressing   Problem: Skin Integrity: Goal: Signs of wound healing will improve Outcome: Progressing   Problem: Health Behavior/Discharge Planning: Goal: Identification of resources available to assist in meeting health care needs will improve Outcome: Progressing   Problem: Bladder/Genitourinary: Goal: Urinary functional status for postoperative course will improve Outcome: Progressing   

## 2018-03-23 NOTE — Op Note (Signed)
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  Postoperative diagnosis: Same  Operative procedure: Anterior cervical discectomy and fusion C4 through 6  Intraoperative monitoring: SSEP/evoked motor potentials were monitored throughout the case.  Transient decrease in right lower extremity motors was noted after implantation of cages.  This returned to baseline.  At the end of the case there was no significant change from baseline.  Implants: Titan Nano lock cervical cages.  7 mm lordotic small.  Allograft:vivogen.  Depuy anterior cervical skyline plate fixed with appropriate size cortical screws.  Indications: This is a very pleasant 57 year old gentleman who had a work-related injury several years ago and has noted progressive debilitating neck pain and loss in neurological function.  For the last 18-24 months he is essentially been wheelchair-bound unable to ambulate due to progressive lower extremity weakness.  Upon evaluation it was felt as though the patient had cervical spondylitic myelopathy.  This was confirmed by cord signal changes seen on MRI.  After discussing options patient elected to move forward with surgery.  I did tell the patient and his family that given the duration of his symptoms and the fact that he has been nonambulatory for almost 2 years that there is a good chance that he would not improve.  However the patient elected to still proceed with surgery given the chance of improvement.  Operative report  Patient was brought the operating room placed upon the operating room table.  After successful induction of general anesthesia and endotracheally patient teds SCDs and a Foley were inserted.  The arms were secured at the side and the neuro monitoring rep then placed all of the appropriate needles for intraoperative SSEP and evoked motor potential monitoring.  The anterior cervical spine was then prepped and draped in a standard  fashion.  Timeout was taken to confirm patient procedure and all other important data.  Patient had a previous left-sided surgical approach for insertion of vagal stimulator and so I elected to proceed on the contralateral side to avoid the scar tissue.  Fluoroscopy was used to identify the C5 vertebral body and the skin incision was marked.  I infiltrated the incision with quarter percent Marcaine and then made a transverse incision centered over the C5 vertebral body.  I sharply dissected down to the platysma sharply incised the platysma.  Identified the medial border the sternocleidomastoid and began by sharp dissection into the deep cervical fascia.  This was a standard Smith-Robinson approach to the anterior cervical spine.  I continued dissecting through the deep cervical fascia until I identified the omohyoid.  I released the omohyoid from its sling which allowed adequate retraction.  I elected not to sacrifice it as visualization and retraction was not a major issue.  I then bluntly dissected through the remaining deep cervical and prevertebral fascia until I could see the anterior longitudinal ligament.  Appendiceal retractor was used to retract and protect the esophagus and I palpated and protected the carotid sheath with my finger.  Once I had the anterior spine exposed I placed the needle into the C4-5 disc space and then took an x-ray to confirm I was at the appropriate level.  Once this was confirmed I then mobilized the longus coli muscle using bipolar electrocautery from the mid body of C4 to mid body of C6.  Anterior exostoses were removed with a Leksell rongeur.  Self-retaining retractor was placed underneath the longus coli muscle and the endotracheal cuff was deflated.  I expanded the  retractor and then reinflated the cuff.  15 blade scalpel was used to perform an annulotomy at C4-5 and then I remove the bulk of the disc material with pituitary rongeurs.  Distraction pins were then placed  into the body of C4 and body of C5 and that I distracted the intervertebral space and maintain the distraction with my distraction pin set.  I then continue using my curettes to remove the disc material working my way posteriorly.  Once I completed the discectomy I then used my nerve hook to gently dissect through the posterior aspect of the annulus.  I then used my 1 mm Kerrison rongeur to take down the posterior annulus.  This allowed me to resect the posterior osteophyte from the C4 and C5 vertebral bodies that was contributing to the cord compression.  Once this was done I could then easily get a plane between the thecal sac and the posterior longitudinal ligament with my nerve hook and then I used a 1 mm Kerrison rongeur to resect the PLL.  At this point I could freely pass my nerve hook underneath the posterior aspects of the C4 and C5 vertebral body.  Imaging studies demonstrated satisfactory overall alignment now that the annulus was released.  I then rasped the endplates and then measured with sequential devices.  I elected to use the size 7 small.  The implant was obtained and packed with the allograft and malleted to the appropriate depth.  Intraoperative x-rays demonstrated restoration of the alignment at the C4-5 level.  In addition the slight retrolisthesis had also resolved.  With the C4-5 level complete I then turned my attention to the C5-6 level.  I used the exact same technique to perform the discectomy at C5-6.  Again with the distractor placed in the bulk of the disc material removed I was able to develop a plane underneath the posterior longitudinal ligament and resect this with my 1 mm Kerrison rongeur.  There was a larger central and slightly to the right osteophyte that was excised on this level consistent with what was seen on the preoperative imaging study.  Again once the discectomy was completed I rasped the endplates to ensure had bleeding subchondral bone and elected to use the same  size implant after trialing different sizes.  With both implants in place x-rays demonstrated satisfactory reduction of the retrolisthesis and restoration of near normal sagittal lordosis.  A 30 mm anterior cervical plate was then obtained and secured to the C4 vertebral body with 16 mm screws and then 14 mm screws into the body of C6.  12 mm screws were used in the body of C5.  All screws had excellent purchase.  The screws were locked to the plate according manufacturer standards.  At this point all the retractors were removed and I copiously irrigated the wound with normal.  Hemostasis was obtained using bipolar cautery and FloSeal.  At this point x-rays were taken which were satisfactory overall alignment was improved retrolisthesis was improved and the implants were in good position.  Final monitoring was performed and the patient had returned to baseline.  The platysma was then closed with interrupted 2-0 Vicryl sutures and the skin with a 3-0 Monocryl.  Steri-Strips and a dry dressing and aspirin collar were applied and the patient was extubated transferred the PACU without incident.  The end of the case all needle sponge counts were correct.

## 2018-03-23 NOTE — Discharge Instructions (Signed)

## 2018-03-23 NOTE — Progress Notes (Signed)
Care of pt assumed by MA Lasonya Hubner RN 

## 2018-03-23 NOTE — Anesthesia Preprocedure Evaluation (Signed)
Anesthesia Evaluation  Patient identified by MRN, date of birth, ID band Patient awake    Reviewed: Allergy & Precautions, NPO status , Patient's Chart, lab work & pertinent test results  Airway Mallampati: I  TM Distance: >3 FB Neck ROM: Full    Dental  (+) Poor Dentition, Missing, Dental Advisory Given   Pulmonary COPD, former smoker,    breath sounds clear to auscultation       Cardiovascular hypertension,  Rhythm:Regular Rate:Normal     Neuro/Psych    GI/Hepatic negative GI ROS, Neg liver ROS,   Endo/Other  negative endocrine ROS  Renal/GU negative Renal ROS     Musculoskeletal  (+) Arthritis ,   Abdominal   Peds  Hematology negative hematology ROS (+)   Anesthesia Other Findings   Reproductive/Obstetrics                             Anesthesia Physical Anesthesia Plan  ASA: II  Anesthesia Plan: General   Post-op Pain Management:    Induction: Intravenous  PONV Risk Score and Plan: 3 and Treatment may vary due to age or medical condition and Dexamethasone  Airway Management Planned: Oral ETT  Additional Equipment:   Intra-op Plan:   Post-operative Plan: Extubation in OR  Informed Consent: I have reviewed the patients History and Physical, chart, labs and discussed the procedure including the risks, benefits and alternatives for the proposed anesthesia with the patient or authorized representative who has indicated his/her understanding and acceptance.   Dental advisory given  Plan Discussed with: CRNA  Anesthesia Plan Comments:         Anesthesia Quick Evaluation

## 2018-03-23 NOTE — Transfer of Care (Signed)
Immediate Anesthesia Transfer of Care Note  Patient: Gerald Rogers  Procedure(s) Performed: ANTERIOR CERVICAL DECOMPRESSION/DISCECTOMY FUSION CERVICAL FOUR-FIVE, CERVICAL FIVE-SIX (N/A Spine Cervical)  Patient Location: PACU  Anesthesia Type:General  Level of Consciousness: sedated  Airway & Oxygen Therapy: Patient Spontanous Breathing and Patient connected to nasal cannula oxygen  Post-op Assessment: Report given to RN and Post -op Vital signs reviewed and stable  Post vital signs: Reviewed and stable  Last Vitals:  Vitals Value Taken Time  BP 91/54 03/23/2018  2:05 PM  Temp    Pulse 70 03/23/2018  2:09 PM  Resp 15 03/23/2018  2:09 PM  SpO2 95 % 03/23/2018  2:09 PM  Vitals shown include unvalidated device data.  Last Pain:  Vitals:   03/23/18 0839  TempSrc:   PainSc: 0-No pain      Patients Stated Pain Goal: 3 (10/08/24 3664)  Complications: No apparent anesthesia complications

## 2018-03-24 ENCOUNTER — Encounter (HOSPITAL_COMMUNITY): Payer: Self-pay | Admitting: Orthopedic Surgery

## 2018-03-24 MED FILL — Thrombin For Soln 20000 Unit: CUTANEOUS | Qty: 1 | Status: AC

## 2018-03-24 NOTE — Clinical Social Work Note (Signed)
Clinical Social Work Assessment  Patient Details  Name: [name] MRN: [id number] Date of Birth: 01/20/1961  Date of referral:  03/24/18               Reason for consult:  Facility Placement                Permission sought to share information with:  Family Supports Permission granted to share information::     Name::     Gerald Rogers  Agency::     Relationship::  sister  Contact Information:  [phone]  Housing/Transportation Living arrangements for the past 2 months:  Single Family Home Source of Information:  Siblings Patient Interpreter Needed:  None Criminal Activity/Legal Involvement Pertinent to Current Situation/Hospitalization:  No - Comment as needed Significant Relationships:  Siblings Lives with:  Siblings Do you feel safe going back to the place where you live?    Need for family participation in patient care:  No (Coment)  Care giving concerns:  Pt's sister Gerald Rogers) requested that CSW contact her as she was helping the pt understand SNF placement. Pt lives at home with his other sister Gerald Rogers).    Social Worker assessment / plan:  Pt's sister reached out to CSW to understand d/c plan. Pt's sister states she would like for the pt to go to a facility near [place]. Pt's sister provided CSW with workers comp case worker Gerald Rogers: [phone]**. CSW will send referral and update RNCM on workers comp Psychologist, counselling. CSW to follow up with pt's sister when bed offers are available.   Employment status:  [employer] information:  Other (Comment Required), Medicare(Workers Comp) PT Recommendations:  [place] / Referral to community resources:  [place]  Patient/Family's Response to care:  Pt's sister verbalized understanding of CSW role and expressed appreciation for support. Pt's sister denies any concern regarding pt care at this time.   Patient/Family's Understanding of and Emotional Response to  Diagnosis, Current Treatment, and Prognosis:  Pt's sister understanding and realistic regarding pt's physical limitations. Pt's sister understands the need for SNF placement for pt at d/c.  Pt'sister denies any concern regarding pt's treatment plan at this time. CSW will continue to provide support and facilitate d/c needs.   Emotional Assessment Appearance:  Appears stated age Attitude/Demeanor/Rapport:  (Patient was appropriate) Affect (typically observed):  Accepting, Appropriate, Calm Orientation:  Oriented to Self, Oriented to Place, Oriented to  Time, Oriented to Situation Alcohol / Substance use:  Not Applicable Psych involvement (Current and /or in the community):  No (Comment)  Discharge Needs  Concerns to be addressed:  Basic Needs, Care Coordination Readmission within the last 30 days:  No Current discharge risk:  Dependent with Mobility Barriers to Discharge:  Continued Medical Work up, [employer], LCSW 03/24/2018, 2:03 PM

## 2018-03-24 NOTE — Progress Notes (Signed)
Pt admitted to the unit as a transfer from [facility]. Pt alert and verbally responsive; VSS: pt oriented to the unit and room; fall/safety precaution and prevention education completed. pt skin clean, dry and intact except for anterior neck honey comb dsg to surgical incision which remains clean, dry and intact. Pt aspen neck collar remain on and aligned. Pt bed alarm on. Call light within reach. Will closely monitor pt. Delia Heady RN

## 2018-03-24 NOTE — Evaluation (Signed)
Occupational Therapy Evaluation Patient Details Name: Gerald Rogers MRN: [id number] DOB: Aug 08, 1961 Today's Date: 03/24/2018    History of Present Illness Pt is a 57 y.o. male s/p C4-6 ACDF. PMH: HTN.   Clinical Impression   Pt reports he was mod I with ADL PTA and used a rollator for mobility; no family present to confirm PLOF information and unsure of accuracy. Currently pt requires min assist for seated UB ADL and max assist for LB ADL. Pt requires max assist +2 for safety with functional mobility using RW; requires assist for balance, posture, and controlling RW. Pt is impulsive and demonstrates unsafe transfer techniques despite max multimodal cues for safety and sequencing. As session progressed pt seemed to have increased confusion--difficulty following commands, poor safety awareness, poor understanding of information being presented to him, and perseverating on use of call button (RN notified of cognitive changes). Recommending CIR for follow up to maximize independence and safety with ADL and functional mobility prior to return home. If pt unable to d/c to CIR; strongly recommend SNF placement for continued rehab. Pt reports hx of falls at home PTA; currently feel he is a high fall and safety risk. Pt would benefit from continued skilled OT to address established goals.    Follow Up Recommendations  CIR;Supervision/Assistance - 24 hour(will need SNF if not CIR)    Equipment Recommendations  Other (comment)(TBD)    Recommendations for Other Services Rehab consult     Precautions / Restrictions Precautions Precautions: Fall;Cervical Required Braces or Orthoses: Cervical Brace Cervical Brace: Hard collar Restrictions Weight Bearing Restrictions: No      Mobility Bed Mobility Overal bed mobility: Needs Assistance Bed Mobility: Rolling;Sidelying to Sit Rolling: Supervision Sidelying to sit: Min guard       General bed mobility comments: Cues throughout for log roll  technique. HOB flat with use of bed rails.  Transfers Overall transfer level: Needs assistance Equipment used: Rolling walker (2 wheeled) Transfers: Sit to/from Stand Sit to Stand: Mod assist;+2 physical assistance;+2 safety/equipment         General transfer comment: Mod assist for balance in standing. Pt with strong hip forward flexion and anterior lean in standing. Pt impulsive with transfers and requires max cues for safety and sequencing.    Balance Overall balance assessment: Needs assistance;History of Falls Sitting-balance support: Feet supported Sitting balance-Leahy Scale: Good     Standing balance support: Bilateral upper extremity supported Standing balance-Leahy Scale: Zero Standing balance comment: max assist to maintain balance                           ADL either performed or assessed with clinical judgement   ADL Overall ADL's : Needs assistance/impaired Eating/Feeding: Set up   Grooming: Minimal assistance;Sitting   Upper Body Bathing: Minimal assistance;Sitting   Lower Body Bathing: Maximal assistance;Sit to/from stand   Upper Body Dressing : Minimal assistance;Sitting   Lower Body Dressing: Maximal assistance;Sit to/from stand   Toilet Transfer: Maximal assistance;+2 for safety/equipment;Ambulation;RW;Cueing for safety;Cueing for sequencing Toilet Transfer Details (indicate cue type and reason): Simulated by sit to stand from EOB with functional mobility in room.         Functional mobility during ADLs: Maximal assistance;+2 for safety/equipment;Rolling walker;Cueing for safety;Cueing for sequencing General ADL Comments: Pt required max physical assist for balance and posture, management of RW, and max cues for safety and sequencing of all activities. Began education regarding cervial precautions, brace management, and compensatory strategies for ADL.  Vision         Perception     Praxis      Pertinent Vitals/Pain Pain  Assessment: Faces Faces Pain Scale: Hurts little more Pain Location: neck Pain Descriptors / Indicators: Discomfort;Sore Pain Intervention(s): Monitored during session;Limited activity within patient's tolerance;Repositioned     Hand Dominance Right   Extremity/Trunk Assessment Upper Extremity Assessment Upper Extremity Assessment: Generalized weakness   Lower Extremity Assessment Lower Extremity Assessment: Defer to PT evaluation   Cervical / Trunk Assessment Cervical / Trunk Assessment: Kyphotic   Communication Communication Communication: No difficulties   Cognition Arousal/Alertness: Awake/alert Behavior During Therapy: Impulsive Overall Cognitive Status: No family/caregiver present to determine baseline cognitive functioning                                 General Comments: At start of session; pt answering questions and following commands appropriately. By the end of session pt with difficulty finding call bell that was placed on his lap and had difficulty understanding why he was sitting in chair and how to use call bell to notify RN he needed something despite extensive education. RN notified of change in cognitive status.   General Comments       Exercises     Shoulder Instructions      Home Living Family/patient expects to be discharged to:: Private residence Living Arrangements: Other relatives(sister) Available Help at Discharge: Family;Available PRN/intermittently Type of Home: House       Home Layout: Two level(bed/bath in basement) Alternate Level Stairs-Number of Steps: flight   Bathroom Shower/Tub: Walk-in Corporate treasurer Toilet: Handicapped height     Home Equipment: Environmental consultant - 4 wheels;Shower seat;Grab bars - toilet;Grab bars - tub/shower;Other (comment)(walking sticks)          Prior Functioning/Environment Level of Independence: Independent with assistive device(s)        Comments: Per pt report; ambulated primarily with  rollator PTA and was mod I with ADL. Was driving and grocery shopping without physical assist.        OT Problem List: Decreased strength;Decreased range of motion;Decreased activity tolerance;Impaired balance (sitting and/or standing);Decreased coordination;Decreased cognition;Decreased safety awareness;Decreased knowledge of use of DME or AE;Decreased knowledge of precautions;Impaired UE functional use;Pain      OT Treatment/Interventions: Self-care/ADL training;Therapeutic exercise;Energy conservation;DME and/or AE instruction;Therapeutic activities;Cognitive remediation/compensation;Patient/family education;Balance training    OT Goals(Current goals can be found in the care plan section) Acute Rehab OT Goals Patient Stated Goal: return home OT Goal Formulation: With patient Time For Goal Achievement: 04/07/18 Potential to Achieve Goals: Good ADL Goals Pt Will Perform Upper Body Bathing: with supervision;sitting Pt Will Perform Lower Body Bathing: with min assist;sit to/from stand Pt Will Transfer to Toilet: with min assist;ambulating;bedside commode Pt Will Perform Toileting - Clothing Manipulation and hygiene: with min assist;sit to/from stand Additional ADL Goal #1: Pt will independently recall cervical precautions and maintain throughout ADL.  OT Frequency: Min 2X/week   Barriers to D/C: Decreased caregiver support;Inaccessible home environment  pt reports flight of stairs down to bed/bath and sister only available intermittently at home       Co-evaluation PT/OT/SLP Co-Evaluation/Treatment: Yes Reason for Co-Treatment: Necessary to address cognition/behavior during functional activity;For patient/therapist safety   OT goals addressed during session: ADL's and self-care      AM-PAC PT "6 Clicks" Daily Activity     Outcome Measure Help from another person eating meals?: A Little Help from another person  taking care of personal grooming?: A Little Help from another person  toileting, which includes using toliet, bedpan, or urinal?: A Lot Help from another person bathing (including washing, rinsing, drying)?: A Lot Help from another person to put on and taking off regular upper body clothing?: A Lot Help from another person to put on and taking off regular lower body clothing?: A Lot 6 Click Score: 14   End of Session Equipment Utilized During Treatment: Gait belt;Rolling walker;Cervical collar Nurse Communication: Mobility status;Other (comment)(follow up needs, pt unsafe to d/c home, ?cognitive changes)  Activity Tolerance: Patient tolerated treatment well Patient left: in chair;with call bell/phone within reach  OT Visit Diagnosis: Unsteadiness on feet (R26.81);Other abnormalities of gait and mobility (R26.89);Repeated falls (R29.6);Muscle weakness (generalized) (M62.81)                Time: 4401-0272 OT Time Calculation (min): 34 min Charges:  OT General Charges $OT Visit: 1 Visit OT Evaluation $OT Eval Moderate Complexity: 1 Mod G-Codes:     Taviana Westergren A. Ulice Brilliant, M.S., OTR/L Pager: [place] 03/24/2018, 9:34 AM

## 2018-03-24 NOTE — Progress Notes (Signed)
Patient transferred to 3W24. Report given to [facility]. Patient not in any form of distress at the time of transfer. POA/sister called by patient himself to update the unit/room trasnfer.    03/24/18 2227  Vitals  Temp 97.8 F (36.6 C)  Temp Source Oral  BP 139/83  MAP (mmHg) 99  BP Location Right Arm  BP Method Automatic  Patient Position (if appropriate) Sitting  Pulse Rate 70  Pulse Rate Source Monitor  Resp 18  Oxygen Therapy  SpO2 95 %  O2 Device Room Air

## 2018-03-24 NOTE — Progress Notes (Signed)
Inpatient Rehabilitation  Per OT request, patient was screened by Gunnar Fusi for appropriateness for an Inpatient Acute Rehab consult.  Note that PT is recommending SNF for post acute rehab due to family not being able to manage at home; however, following a potential IP Rehab stay patient may be able to be managed at home.  As a result, we are recommending an Inpatient Rehab consult for MD to help at determining most appropriate post acute rehab venue.  Please order if you are agreeable.  Call if questions.   Carmelia Roller., CCC/SLP Admission Coordinator  [place]  Cell [phone]

## 2018-03-24 NOTE — Progress Notes (Signed)
    Subjective: Procedure(s) (LRB): ANTERIOR CERVICAL DECOMPRESSION/DISCECTOMY FUSION CERVICAL FOUR-FIVE, CERVICAL FIVE-SIX (N/A) 1 Day Post-Op  Patient reports pain as 2 on 0-10 scale.  Reports decreased arm pain reports incisional neck pain   Positive void Negative bowel movement Positive flatus Negative chest pain or shortness of breath  Objective: Vital signs in last 24 hours: Temp:  [97.7 F (36.5 C)-98.4 F (36.9 C)] 98.2 F (36.8 C) (04/12 0325) Pulse Rate:  [65-88] 74 (04/12 0325) Resp:  [11-19] 18 (04/12 0325) BP: (91-144)/(46-95) 108/60 (04/12 0325) SpO2:  [90 %-95 %] 90 % (04/12 0325) Weight:  [59.7 kg (131 lb 9.6 oz)] 59.7 kg (131 lb 9.6 oz) (04/11 0811)  Intake/Output from previous day: 04/11 0701 - 04/12 0700 In: 1943 [P.O.:120; I.V.:1623] Out: 3300 [Urine:3200; Blood:100]  Labs: No results for input(s): WBC, RBC, HCT, PLT in the last 72 hours. No results for input(s): NA, K, CL, CO2, BUN, CREATININE, GLUCOSE, CALCIUM in the last 72 hours. No results for input(s): LABPT, INR in the last 72 hours.  Physical Exam: Intact pulses distally Incision: dressing C/D/I Compartment soft LE: significant weakness EHL/TA/GA (2/5).  Slight improvement in quad/hip flexors: 3+/5    UE: 5/5 throughout (no change from baseline) Body mass index is 21.9 kg/m.  Assessment/Plan: Patient stable  xrays n/a Mobilization with physical therapy Encourage incentive spirometry Continue care  Advance diet Up with therapy  Patient was able to stand yesterday for transfer. Slight improvement in LE weakness.   Question d/c to home or CIR - will await PT/OT eval to see which is recommended   Melina Schools, MD [facility] [phone]

## 2018-03-24 NOTE — Evaluation (Signed)
Physical Therapy Evaluation Patient Details Name: Gerald Rogers MRN: [id number] DOB: [birth year] Today's Date: 03/24/2018   History of Present Illness  Pt is a 57 y.o. male s/p C4-6 ACDF. PMH: HTN.  Clinical Impression  Pt admitted with above diagnosis. Pt currently with functional limitations due to the deficits listed below (see PT Problem List). At the time of PT eval pt was able to perform transfers and ambulation with up to +2 max assist for balance support, safety, and walker management. Almost constant multimodal cues were required for transition into the bathroom, and then walk to the recliner. Discussed pt case with sister/caregiver via telephone after session. Sister reports that the family is having difficulty managing the patient at home, and PT educated her regarding recommendation for SNF for continued skilled rehab services. Acutely, pt will benefit from skilled PT to increase their independence and safety with mobility to allow discharge to the venue listed below.       Follow Up Recommendations SNF;Supervision/Assistance - 24 hour    Equipment Recommendations  Rolling walker with 5" wheels    Recommendations for Other Services       Precautions / Restrictions Precautions Precautions: Fall;Cervical Required Braces or Orthoses: Cervical Brace Cervical Brace: Hard collar;At all times Restrictions Weight Bearing Restrictions: No      Mobility  Bed Mobility Overal bed mobility: Needs Assistance Bed Mobility: Rolling;Sidelying to Sit Rolling: Supervision Sidelying to sit: Min guard       General bed mobility comments: Cues throughout for log roll technique. HOB flat with use of bed rails.  Transfers Overall transfer level: Needs assistance Equipment used: Rolling walker (2 wheeled) Transfers: Sit to/from Stand Sit to Stand: Mod assist;+2 physical assistance;+2 safety/equipment         General transfer comment: Mod assist for balance in standing. Pt with  strong hip forward flexion and anterior lean in standing. Essentially pt's shoulders were parallel with the floor. Pt impulsive with transfers and requires max cues for safety and sequencing.  Ambulation/Gait Ambulation/Gait assistance: Max assist;+2 physical assistance;+2 safety/equipment Ambulation Distance (Feet): 30 Feet(10', then 20') Assistive device: Rolling walker (2 wheeled) Gait Pattern/deviations: Step-to pattern;Decreased stride length;Trunk flexed;Shuffle;Ataxic Gait velocity: Decreased Gait velocity interpretation: Below normal speed for age/gender General Gait Details: Pt unable to make postural corrections with cues and ambulated with extremely flexed trunk. Pt required assist for balance support, walker management, and upright posture at all times.   Stairs            Wheelchair Mobility    Modified Rankin (Stroke Patients Only)       Balance Overall balance assessment: Needs assistance;History of Falls Sitting-balance support: Feet supported Sitting balance-Leahy Scale: Good     Standing balance support: Bilateral upper extremity supported Standing balance-Leahy Scale: Zero Standing balance comment: max assist to maintain balance                             Pertinent Vitals/Pain Pain Assessment: Faces Faces Pain Scale: Hurts little more Pain Location: neck Pain Descriptors / Indicators: Discomfort;Sore Pain Intervention(s): Monitored during session    Home Living Family/patient expects to be discharged to:: Private residence Living Arrangements: Other relatives(sister) Available Help at Discharge: Family;Available PRN/intermittently Type of Home: House       Home Layout: Two level(bed/bath in basement) Home Equipment: Walker - 4 wheels;Shower seat;Grab bars - toilet;Grab bars - tub/shower;Other (comment);Hand held shower head(walking sticks)      Prior Function Level  of Independence: Independent with assistive device(s)          Comments: Per pt report; ambulated primarily with rollator PTA and was mod I with ADL. Was driving and grocery shopping without physical assist. Per sister's report, pt was using a wheelchair for community mobility, was heavily relying on UE support on furniture or his rollator for ambulation in the house, and required +2 assist to get the pt up and down the stairs.     Hand Dominance   Dominant Hand: Right    Extremity/Trunk Assessment   Upper Extremity Assessment Upper Extremity Assessment: Generalized weakness    Lower Extremity Assessment Lower Extremity Assessment: Generalized weakness    Cervical / Trunk Assessment Cervical / Trunk Assessment: Kyphotic  Communication   Communication: No difficulties  Cognition Arousal/Alertness: Awake/alert Behavior During Therapy: Impulsive Overall Cognitive Status: History of cognitive impairments - at baseline                                 General Comments: At start of session; pt answering questions and following commands appropriately. By the end of session pt with difficulty finding call bell that was placed on his lap and had difficulty understanding why he was sitting in chair and how to use call bell to notify RN he needed something despite extensive education. RN notified of change in cognitive status.      General Comments      Exercises     Assessment/Plan    PT Assessment Patient needs continued PT services  PT Problem List Decreased strength;Decreased range of motion;Decreased activity tolerance;Decreased balance;Decreased mobility;Decreased knowledge of use of DME;Decreased safety awareness;Decreased knowledge of precautions;Pain       PT Treatment Interventions DME instruction;Gait training;Stair training;Functional mobility training;Therapeutic activities;Therapeutic exercise;Neuromuscular re-education;Patient/family education    PT Goals (Current goals can be found in the Care Plan section)   Acute Rehab PT Goals Patient Stated Goal: Home after rehab PT Goal Formulation: With patient/family Time For Goal Achievement: 04/07/18 Potential to Achieve Goals: Good    Frequency Min 5X/week   Barriers to discharge Inaccessible home environment;Decreased caregiver support Pt requiring Max assist for balance support at this time.     Co-evaluation PT/OT/SLP Co-Evaluation/Treatment: Yes Reason for Co-Treatment: Necessary to address cognition/behavior during functional activity;For patient/therapist safety;To address functional/ADL transfers PT goals addressed during session: Mobility/safety with mobility;Balance;Proper use of DME OT goals addressed during session: ADL's and self-care       AM-PAC PT "6 Clicks" Daily Activity  Outcome Measure Difficulty turning over in bed (including adjusting bedclothes, sheets and blankets)?: Unable Difficulty moving from lying on back to sitting on the side of the bed? : Unable Difficulty sitting down on and standing up from a chair with arms (e.g., wheelchair, bedside commode, etc,.)?: Unable Help needed moving to and from a bed to chair (including a wheelchair)?: A Lot Help needed walking in hospital room?: A Lot Help needed climbing 3-5 steps with a railing? : Total 6 Click Score: 8    End of Session Equipment Utilized During Treatment: Gait belt Activity Tolerance: Patient tolerated treatment well Patient left: in chair;with call bell/phone within reach Nurse Communication: Mobility status PT Visit Diagnosis: Unsteadiness on feet (R26.81);Pain;Other symptoms and signs involving the nervous system (R29.898) Pain - part of body: (Neck)    Time: 7510-2585 PT Time Calculation (min) (ACUTE ONLY): 28 min   Charges:   PT Evaluation $PT Eval Moderate Complexity: 1  Mod     PT G Codes:        Rolinda Roan, PT, DPT Acute Rehabilitation Services Pager: [phone]   Thelma Comp 03/24/2018, 11:05 AM

## 2018-03-25 NOTE — Progress Notes (Signed)
Pt continue to be confused getting out of bed witthout calling for assist and very impulsive.   May needs nicotine path for withdrawal.

## 2018-03-25 NOTE — Progress Notes (Signed)
Report received  Pt resting quietly in bed   Aspen collar in use anterior surgical dressing intact , pt denied any C /o of pain.  Prior BP was  BP , but pt refused recheck of VSS at [place] and RN re- assessment.

## 2018-03-25 NOTE — Progress Notes (Signed)
Gerald Rogers  MRN: [id number] DOB/Age: March 05, 1961 57 y.o. [facility] Procedure: Procedure(s) (LRB): ANTERIOR CERVICAL DECOMPRESSION/DISCECTOMY FUSION CERVICAL FOUR-FIVE, CERVICAL FIVE-SIX (N/A)     Subjective: Up in chair, no specific complaints. Good PO intake, nursing reports confusion last night. Appears appropriate now  Vital Signs Temp:  [97.6 F (36.4 C)-99.4 F (37.4 C)] 98.8 F (37.1 C) (04/13 0755) Pulse Rate:  [65-80] 73 (04/13 0755) Resp:  [16-20] 20 (04/13 0755) BP: (106-154)/(66-137) 120/76 (04/13 0755) SpO2:  [92 %-96 %] 94 % (04/13 0755)  Lab Results No results for input(s): WBC, HGB, HCT, PLT in the last 72 hours. BMET No results for input(s): NA, K, CL, CO2, GLUCOSE, BUN, CREATININE, CALCIUM in the last 72 hours. No results found for: INR   Exam Good strength on bilat upper extremity testing In C collar        Plan Awaiting his progress with PT/OT and disposition planning Continue current therapies  Jenetta Loges PA-C  03/25/2018, 10:15 AM Contact # [phone]

## 2018-03-25 NOTE — Progress Notes (Signed)
Physical Therapy Treatment Patient Details Name: Gerald Rogers MRN: [id number] DOB: October 27, 1961 Today's Date: 03/25/2018    History of Present Illness Pt is a 57 y.o. male s/p C4-6 ACDF. PMH: HTN.    PT Comments    Pt received in bed and agreeable to participation in therapy. A&Ox 3. Pt is impulsive and at high risk for falls. +2 mod assist required for sit and stand with RW and +2 max assist ambulation 10 feet x 2 with RW. D/C recommendation updated to CIR. Pt was modified independent PTA and would benefit from intensive therapy program prior to d/c home.   Follow Up Recommendations  CIR;Supervision/Assistance - 24 hour(SNF if does not qualify for CIR)     Equipment Recommendations  Rolling walker with 5" wheels    Recommendations for Other Services       Precautions / Restrictions Precautions Precautions: Fall;Cervical Precaution Comments: Reviewed cervical precautions. Required Braces or Orthoses: Cervical Brace Cervical Brace: Hard collar;At all times    Mobility  Bed Mobility     Rolling: Modified independent (Device/Increase time) Sidelying to sit: Min guard;HOB elevated       General bed mobility comments: verbal cues for sequencing  Transfers   Equipment used: Rolling walker (2 wheeled)   Sit to Stand: Mod assist;+2 physical assistance         General transfer comment: Pt with strong forward trunk flexion at hips.  Mod/max assist to attain/maintain upright posture. Max cues for safety.  Ambulation/Gait Ambulation/Gait assistance: Max assist;+2 physical assistance Ambulation Distance (Feet): 10 Feet(x 2) Assistive device: Rolling walker (2 wheeled) Gait Pattern/deviations: Step-to pattern;Decreased stride length;Trunk flexed;Ataxic Gait velocity: Decreased Gait velocity interpretation: <1.8 ft/sec, indicate of risk for recurrent falls General Gait Details: Pt unable to maintain upright posture. Trunk flexion increased as distance increased.     Stairs             Wheelchair Mobility    Modified Rankin (Stroke Patients Only)       Balance Overall balance assessment: Needs assistance;History of Falls Sitting-balance support: Feet supported;No upper extremity supported Sitting balance-Leahy Scale: Good     Standing balance support: Bilateral upper extremity supported;During functional activity Standing balance-Leahy Scale: Zero Standing balance comment: max assist to maintain balance                            Cognition Arousal/Alertness: Awake/alert Behavior During Therapy: Impulsive Overall Cognitive Status: History of cognitive impairments - at baseline                                        Exercises      General Comments        Pertinent Vitals/Pain Pain Assessment: Faces Faces Pain Scale: Hurts little more Pain Location: neck Pain Descriptors / Indicators: Grimacing;Guarding Pain Intervention(s): Monitored during session;Repositioned    Home Living                      Prior Function            PT Goals (current goals can now be found in the care plan section) Acute Rehab PT Goals Patient Stated Goal: Home after rehab PT Goal Formulation: With patient/family Time For Goal Achievement: 04/07/18 Potential to Achieve Goals: Good Progress towards PT goals: Progressing toward goals    Frequency  Min 5X/week      PT Plan Discharge plan needs to be updated    Co-evaluation              AM-PAC PT "6 Clicks" Daily Activity  Outcome Measure  Difficulty turning over in bed (including adjusting bedclothes, sheets and blankets)?: A Little Difficulty moving from lying on back to sitting on the side of the bed? : A Little Difficulty sitting down on and standing up from a chair with arms (e.g., wheelchair, bedside commode, etc,.)?: Unable Help needed moving to and from a bed to chair (including a wheelchair)?: A Lot Help needed walking in  hospital room?: A Lot Help needed climbing 3-5 steps with a railing? : Total 6 Click Score: 12    End of Session Equipment Utilized During Treatment: Gait belt;Cervical collar Activity Tolerance: Patient tolerated treatment well Patient left: in chair;with call bell/phone within reach;with chair alarm set Nurse Communication: Mobility status PT Visit Diagnosis: Unsteadiness on feet (R26.81);Pain;Other symptoms and signs involving the nervous system (B86.754)     Time: 4920-1007 PT Time Calculation (min) (ACUTE ONLY): 15 min  Charges:  $Gait Training: 8-22 mins                    G Codes:       Gerald Rogers, PT  Office # [phone] Pager [phone]    Gerald Rogers 03/25/2018, 10:26 AM

## 2018-03-25 NOTE — Progress Notes (Signed)
During RN rounds RN noted pt up in bed, confused, trying to get out of bed with out calling .  Nurse reorientedt and redirect pt  to get back in bed with assist. VSS checked at this time.  BP high 154/84.  Pt  c/o pain rated 7/10 but pt refused being medicated by primary RN. Charge nurse called in to offered medication  and pt took it. I /S  provided and pt demonstrated it use. RN will continue to monitor.

## 2018-03-26 NOTE — Progress Notes (Signed)
Subjective: 3 Days Post-Op Procedure(s) (LRB): ANTERIOR CERVICAL DECOMPRESSION/DISCECTOMY FUSION CERVICAL FOUR-FIVE, CERVICAL FIVE-SIX (N/A) Patient reports pain as 1 on 0-10 scale.  He is doing very well today. Great strength in his extremities.  No major complaints.  Objective: Vital signs in last 24 hours: Temp:  [97.6 F (36.4 C)-98.8 F (37.1 C)] 98.8 F (37.1 C) (04/14 0835) Pulse Rate:  [63-88] 71 (04/14 0835) Resp:  [16-20] 16 (04/14 0835) BP: (103-131)/(69-83) 118/74 (04/14 0835) SpO2:  [86 %-94 %] 94 % (04/14 0835)  Intake/Output from previous day: 04/13 0701 - 04/14 0700 In: 150 [P.O.:150] Out: -  Intake/Output this shift: No intake/output data recorded.  No results for input(s): HGB in the last 72 hours. No results for input(s): WBC, RBC, HCT, PLT in the last 72 hours. No results for input(s): NA, K, CL, CO2, BUN, CREATININE, GLUCOSE, CALCIUM in the last 72 hours. No results for input(s): LABPT, INR in the last 72 hours.  Neurovascular intact    Assessment/Plan: 3 Days Post-Op Procedure(s) (LRB): ANTERIOR CERVICAL DECOMPRESSION/DISCECTOMY FUSION CERVICAL FOUR-FIVE, CERVICAL FIVE-SIX (N/A) Up with therapy    Latanya Maudlin 03/26/2018, 8:37 AM

## 2018-03-26 NOTE — Progress Notes (Signed)
Pt up and tried to get oob to bathroom unassisted.  Nursing staff tried to help pt and pt became agitated and resistance to assist. Gait very unsteady. Pt reoriented and redirected /reminded of safety measures and fall prevention.  Pt later calm down and assisted to the bath room with nsg staff and walker.  Sensitive bed alarm on and other safety precautions in place. Reminded to use the call bell for assist . Call bell  within pt reach. Pt denied pain.

## 2018-03-27 NOTE — Progress Notes (Signed)
Family has chosen Counselling psychologist in [place]- they are working with [employer], water quality for rehab- awaiting this being finalized  CSW will continue to follow  Jorge Ny, [place] Social Worker [phone]

## 2018-03-27 NOTE — Progress Notes (Signed)
    Subjective: 4 Days Post-Op Procedure(s) (LRB): ANTERIOR CERVICAL DECOMPRESSION/DISCECTOMY FUSION CERVICAL FOUR-FIVE, CERVICAL FIVE-SIX (N/A) Patient reports pain as 0 on 0-10 scale.   Denies CP or SOB.  Voiding without difficulty. Positive flatus. Pt reports increasing strength in lower extremities Objective: Vital signs in last 24 hours: Temp:  [98.1 F (36.7 C)-98.4 F (36.9 C)] 98.4 F (36.9 C) (04/15 1127) Pulse Rate:  [66-71] 71 (04/15 1127) Resp:  [16-22] 16 (04/15 1127) BP: (102-119)/(63-97) 102/71 (04/15 1127) SpO2:  [89 %-94 %] 91 % (04/15 1127)  Intake/Output from previous day: No intake/output data recorded. Intake/Output this shift: No intake/output data recorded.  Labs: No results for input(s): HGB in the last 72 hours. No results for input(s): WBC, RBC, HCT, PLT in the last 72 hours. No results for input(s): NA, K, CL, CO2, BUN, CREATININE, GLUCOSE, CALCIUM in the last 72 hours. No results for input(s): LABPT, INR in the last 72 hours.  Physical Exam: Neurologically intact ABD soft Sensation intact distally Dorsiflexion/Plantar flexion intact Incision: scant drainage Compartment soft Body mass index is 21.9 kg/m. Aspen collar in place  Assessment/Plan: 4 Days Post-Op Procedure(s) (LRB): ANTERIOR CERVICAL DECOMPRESSION/DISCECTOMY FUSION CERVICAL FOUR-FIVE, CERVICAL FIVE-SIX (N/A) Up with therapy Discharge to SNF  Keila Turan, Darla Lesches for Dr. Melina Schools [facility] [phone] 03/27/2018, 1:10 PM

## 2018-03-27 NOTE — Progress Notes (Signed)
Physical Therapy Treatment Patient Details Name: Gerald Rogers MRN: [id number] DOB: Oct 20, 1961 Today's Date: 03/27/2018    History of Present Illness Pt is a 57 y.o. male s/p C4-6 ACDF. PMH: HTN.    PT Comments    Patient required mod/max A for transfers and gait training this session. Pt did tolerate increased gait distance and eager to mobilize. Pt continues to demonstrate balance and cognitive deficits increasing risk for falls. Current plan remains appropriate.    Follow Up Recommendations  CIR;Supervision/Assistance - 24 hour(SNF if does not qualify for CIR)     Equipment Recommendations  Rolling walker with 5" wheels    Recommendations for Other Services       Precautions / Restrictions Precautions Precautions: Fall;Cervical Precaution Comments: Reviewed cervical precautions. Required Braces or Orthoses: Cervical Brace Cervical Brace: Hard collar;Other (comment)(Per orders can be removed in bed)    Mobility  Bed Mobility Overal bed mobility: Needs Assistance Bed Mobility: Supine to Sit;Sit to Supine     Supine to sit: Supervision Sit to supine: Supervision   General bed mobility comments: verbal cues for sequencing  Transfers Overall transfer level: Needs assistance Equipment used: Rolling walker (2 wheeled) Transfers: Sit to/from Stand Sit to Stand: Mod assist         General transfer comment: assist to gain balance upon standing and cues for safe hand placement on RW; cues for safety as pt began standing without RW in front of him  Ambulation/Gait Ambulation/Gait assistance: Mod assist;Max assist Ambulation Distance (Feet): 90 Feet Assistive device: Rolling walker (2 wheeled) Gait Pattern/deviations: Step-to pattern;Decreased stride length;Trunk flexed;Ataxic Gait velocity: Decreased   General Gait Details: max multimodal cues for posture and sequencing of gait with use of AD; pt tends to step with R foot then L foot getting right up against front  of RW then loses balance posteriorly and then attempts to push RW forward; therapist assisted to keep RW moving forward more fluidly however no improvement in sequenicng noted; assist needed for balance and safe use of AD   Stairs             Wheelchair Mobility    Modified Rankin (Stroke Patients Only)       Balance Overall balance assessment: Needs assistance;History of Falls Sitting-balance support: Feet supported;No upper extremity supported Sitting balance-Leahy Scale: Good     Standing balance support: During functional activity;Bilateral upper extremity supported Standing balance-Leahy Scale: Poor Standing balance comment: reliant on bilat UE support                            Cognition Arousal/Alertness: Awake/alert Behavior During Therapy: Impulsive Overall Cognitive Status: Impaired/Different from baseline Area of Impairment: Memory;Following commands;Safety/judgement;Problem solving                     Memory: Decreased short-term memory Following Commands: Follows one step commands with increased time Safety/Judgement: Decreased awareness of deficits;Decreased awareness of safety   Problem Solving: Difficulty sequencing;Requires verbal cues        Exercises      General Comments        Pertinent Vitals/Pain Pain Assessment: Faces Faces Pain Scale: Hurts a little bit Pain Location: neck Pain Descriptors / Indicators: Discomfort Pain Intervention(s): Monitored during session;Repositioned    Home Living                      Prior Function  PT Goals (current goals can now be found in the care plan section) Acute Rehab PT Goals Patient Stated Goal: Home after rehab PT Goal Formulation: With patient/family Time For Goal Achievement: 04/07/18 Potential to Achieve Goals: Good Progress towards PT goals: Progressing toward goals    Frequency    Min 5X/week      PT Plan Current plan remains  appropriate    Co-evaluation              AM-PAC PT "6 Clicks" Daily Activity  Outcome Measure  Difficulty turning over in bed (including adjusting bedclothes, sheets and blankets)?: A Little Difficulty moving from lying on back to sitting on the side of the bed? : A Little Difficulty sitting down on and standing up from a chair with arms (e.g., wheelchair, bedside commode, etc,.)?: Unable Help needed moving to and from a bed to chair (including a wheelchair)?: A Lot Help needed walking in hospital room?: A Lot Help needed climbing 3-5 steps with a railing? : Total 6 Click Score: 12    End of Session Equipment Utilized During Treatment: Gait belt;Cervical collar Activity Tolerance: Patient tolerated treatment well Patient left: with call bell/phone within reach;in bed;with bed alarm set Nurse Communication: Mobility status PT Visit Diagnosis: Unsteadiness on feet (R26.81);Pain;Other symptoms and signs involving the nervous system (R29.898) Pain - part of body: (Neck)     Time: 9833-8250 PT Time Calculation (min) (ACUTE ONLY): 20 min  Charges:  $Gait Training: 8-22 mins                    G Codes:       Earney Navy, PTA Pager: [phone]     Darliss Cheney 03/27/2018, 4:39 PM

## 2018-03-27 NOTE — NC FL2 (Signed)
[place] LEVEL OF CARE SCREENING TOOL     IDENTIFICATION  Patient Name: Gerald Rogers Birthdate: 10-Nov-1961 Sex: male Admission Date (Current Location): 03/23/2018  [facility] and [place] Number:  Herbalist and Address:  [facility], [place] [address], [place], [place] [address]      Provider Number: [id number]  Attending Physician Name and Address:  Melina Schools, MD  Relative Name and Phone Number:       Current Level of Care: Hospital Recommended Level of Care: [place] Prior Approval Number:    Date Approved/Denied:   PASRR Number: [id number]  Discharge Plan: SNF    Current Diagnoses: Patient Active Problem List   Diagnosis Date Noted  . Myelopathy ([place]) 03/23/2018  . 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 Tobacco abuse 06/04/2017  . Spasticity 05/03/2017  . Hyperlipidemia 05/13/2016  . Abnormality of gait 01/13/2015  . Tremor 06/28/2013  . Other convulsions 06/28/2013  . HYPERTENSION, BENIGN 05/20/2008    Orientation RESPIRATION BLADDER Height & Weight     Self, Time, Situation, Place  Normal Continent Weight: 131 lb 9.6 oz (59.7 kg) Height:  5\' 5"  (165.1 cm)  BEHAVIORAL SYMPTOMS/MOOD NEUROLOGICAL BOWEL NUTRITION STATUS      Continent Diet(regular)  AMBULATORY STATUS COMMUNICATION OF NEEDS Skin   Extensive Assist Verbally Normal                       Personal Care Assistance Level of Assistance  Bathing, Feeding, Dressing Bathing Assistance: Maximum assistance   Dressing Assistance: Maximum assistance     Functional Limitations Info             SPECIAL CARE FACTORS FREQUENCY  PT (By licensed PT), OT (By licensed OT)     PT Frequency: 5/wk OT Frequency: 5/wk            Contractures      Additional Factors Info  Code Status, Allergies Code Status Info: FULL Allergies Info: Cymbalta Duloxetine Hcl, Klonopin Clonazepam           Current  Medications (03/27/2018):  This is the current hospital active medication list Current Facility-Administered Medications  Medication Dose Route Frequency Provider Last Rate Last Dose  . 0.9 %  sodium chloride infusion  250 mL Intravenous Continuous Melina Schools, MD      . acetaminophen (TYLENOL) tablet 650 mg  650 mg Oral Q4H PRN Melina Schools, MD   650 mg at 03/24/18 1949   Or  . acetaminophen (TYLENOL) suppository 650 mg  650 mg Rectal Q4H PRN Melina Schools, MD      . gabapentin (NEURONTIN) capsule 1,200 mg  1,200 mg Oral BID Melina Schools, MD   1,200 mg at 03/27/18 0844  . losartan (COZAAR) tablet 100 mg  100 mg Oral Daily Melina Schools, MD   100 mg at 03/27/18 0843   And  . hydrochlorothiazide (HYDRODIURIL) tablet 25 mg  25 mg Oral Daily Melina Schools, MD   25 mg at 03/27/18 0844  . lactated ringers infusion   Intravenous Continuous Melina Schools, MD      . lamoTRIgine (LAMICTAL) tablet 200 mg  200 mg Oral BID Melina Schools, MD   200 mg at 03/27/18 0844  . menthol-cetylpyridinium (CEPACOL) lozenge 3 mg  1 lozenge Oral PRN Melina Schools, MD       Or  . phenol (CHLORASEPTIC) mouth spray 1 spray  1 spray Mouth/Throat PRN Melina Schools,  MD      . methocarbamol (ROBAXIN) tablet 500 mg  500 mg Oral Q6H PRN Melina Schools, MD   500 mg at 03/24/18 1537   Or  . methocarbamol (ROBAXIN) 500 mg in dextrose 5 % 50 mL IVPB  500 mg Intravenous Q6H PRN Melina Schools, MD      . morphine 4 MG/ML injection 2 mg  2 mg Intravenous Q2H PRN Melina Schools, MD      . ondansetron [facility]) tablet 4 mg  4 mg Oral Q6H PRN Melina Schools, MD       Or  . ondansetron [facility]) injection 4 mg  4 mg Intravenous Q6H PRN Melina Schools, MD      . oxyCODONE (Oxy IR/ROXICODONE) immediate release tablet 10 mg  10 mg Oral Q3H PRN Melina Schools, MD   10 mg at 03/25/18 0318  . oxyCODONE (Oxy IR/ROXICODONE) immediate release tablet 5 mg  5 mg Oral Q3H PRN Melina Schools, MD   5 mg at 03/25/18 2020  . polyethylene  glycol (MIRALAX / GLYCOLAX) packet 17 g  17 g Oral Daily PRN Melina Schools, MD      . pravastatin (PRAVACHOL) tablet 20 mg  20 mg Oral Daily Melina Schools, MD   20 mg at 03/27/18 0843  . propranolol ER (INDERAL LA) 24 hr capsule 80 mg  80 mg Oral Daily Melina Schools, MD   80 mg at 03/27/18 0843  . sodium chloride flush (NS) 0.9 % injection 3 mL  3 mL Intravenous Q12H Melina Schools, MD   3 mL at 03/23/18 2132  . sodium chloride flush (NS) 0.9 % injection 3 mL  3 mL Intravenous PRN Melina Schools, MD         Discharge Medications: Please see discharge summary for a list of discharge medications.  Relevant Imaging Results:  Relevant Lab Results:   Additional Information SS#: [id number]  Jorge Ny, LCSW

## 2018-03-27 NOTE — Discharge Summary (Addendum)
Physician Discharge Summary  Patient ID: Gerald Rogers MRN: [id number] DOB/AGE: 59-Nov-1962 59 y.o.  Admit date: 03/23/2018 Discharge date: 03/28/18  Admission Diagnoses:  Cervical Myelopathy  Discharge Diagnoses:  Active Problems:   Myelopathy [facility])   Past Medical History:  Diagnosis Date  . Anxiety   . Arteriovenous fistula of spinal cord vessels   . Arthritis   . Headache   . Hyperlipidemia   . Hypertension     Surgeries: Procedure(s): ANTERIOR CERVICAL DECOMPRESSION/DISCECTOMY FUSION CERVICAL FOUR-FIVE, CERVICAL FIVE-SIX on 03/23/2018   Consultants (if any):   Discharged Condition: Improved  Hospital Course: Gerald Rogers is an 57 y.o. male who was admitted 03/23/2018 with a diagnosis of Cervical Myelopathy and went to the operating room on 03/23/2018 and underwent the above named procedures.  Post op day one pt reports low level of pain.  Slight improvement in LE weakness. Post op day 2 pain level still well controlled.  Confusion overnight.  Resolved during the day.  Post op day 3 Low pain level.  Improving strength in LE.  Post op day 4 no pain.  WC case worker/ SW are working to get the pt discharged to a Rehab facility.   He was given perioperative antibiotics:  Anti-infectives (From admission, onward)   Start     Dose/Rate Route Frequency Ordered Stop   03/23/18 1900  ceFAZolin (ANCEF) IVPB 2g/100 mL premix     2 g 200 mL/hr over 30 Minutes Intravenous Every 8 hours 03/23/18 1745 03/24/18 0313   03/23/18 0757  ceFAZolin (ANCEF) IVPB 2g/100 mL premix     2 g 200 mL/hr over 30 Minutes Intravenous 30 min pre-op 03/23/18 0757 03/23/18 1015    .  He was given sequential compression devices, early ambulation, and TED for DVT prophylaxis.  He benefited maximally from the hospital stay and there were no complications.    Recent vital signs:  Vitals:   03/27/18 0756 03/27/18 1127  BP: 115/74 102/71  Pulse: 69 71  Resp: 18 16  Temp: 98.1 F (36.7 C) 98.4 F  (36.9 C)  SpO2: 92% 91%    Recent laboratory studies:  Lab Results  Component Value Date   HGB 15.8 03/20/2018   HGB 15.7 07/03/2012   Lab Results  Component Value Date   WBC 10.7 (H) 03/20/2018   PLT 270 03/20/2018   No results found for: INR Lab Results  Component Value Date   NA 134 (L) 03/20/2018   K 4.2 03/20/2018   CL 96 (L) 03/20/2018   CO2 25 03/20/2018   BUN 21 (H) 03/20/2018   CREATININE 1.15 03/20/2018   GLUCOSE 95 03/20/2018    Discharge Medications:   Allergies as of 03/27/2018      Reactions   Cymbalta [duloxetine Hcl] Other (See Comments)   Generalized weakness and mental status changes   Klonopin [clonazepam] Other (See Comments)   Mental status changes      Medication List    STOP taking these medications   baclofen 10 MG tablet Commonly known as:  LIORESAL   diazepam 5 MG tablet Commonly known as:  VALIUM   ibuprofen 200 MG tablet Commonly known as:  ADVIL,MOTRIN     TAKE these medications   aspirin 325 MG tablet Take 325 mg by mouth daily.   gabapentin 300 MG capsule Commonly known as:  NEURONTIN Take 1,200 mg by mouth 2 (two) times daily.   LamoTRIgine 200 MG Tb24 24 hour tablet Take 200 mg by mouth 2 (  two) times daily.   losartan-hydrochlorothiazide 100-25 MG tablet Commonly known as:  HYZAAR Take 1 tablet by mouth daily.   methocarbamol 500 MG tablet Commonly known as:  ROBAXIN Take 1 tablet (500 mg total) by mouth 3 (three) times daily as needed for muscle spasms.   ondansetron 4 MG tablet Commonly known as:  ZOFRAN Take 1 tablet (4 mg total) by mouth every 8 (eight) hours as needed for nausea or vomiting.   oxyCODONE-acetaminophen 10-325 MG tablet Commonly known as:  PERCOCET Take 1 tablet by mouth every 6 (six) hours as needed for pain.   pravastatin 20 MG tablet Commonly known as:  PRAVACHOL TAKE 1 TABLET (20 MG TOTAL) BY MOUTH DAILY.   propranolol ER 80 MG 24 hr capsule Commonly known as:  INDERAL LA Take 80  mg by mouth daily.       Diagnostic Studies: Dg Cervical Spine 2-3 Views  Result Date: 03/23/2018 CLINICAL DATA:  Cervical fusion EXAM: DG C-ARM 61-120 MIN; CERVICAL SPINE - 2-3 VIEW COMPARISON:  None. FLUOROSCOPY TIME:  Fluoroscopy Time:  30 seconds Radiation Exposure Index (if provided by the fluoroscopic device): Not available Number of Acquired Spot Images: 2 FINDINGS: Interbody fusion is noted at C4-5 and C5-6 with anterior fixation. No acute bony abnormality is noted. IMPRESSION: Cervical fusion. Electronically Signed   By: Inez Catalina M.D.   On: 03/23/2018 14:18   Dg C-arm 1-60 Min  Result Date: 03/23/2018 CLINICAL DATA:  Cervical fusion EXAM: DG C-ARM 61-120 MIN; CERVICAL SPINE - 2-3 VIEW COMPARISON:  None. FLUOROSCOPY TIME:  Fluoroscopy Time:  30 seconds Radiation Exposure Index (if provided by the fluoroscopic device): Not available Number of Acquired Spot Images: 2 FINDINGS: Interbody fusion is noted at C4-5 and C5-6 with anterior fixation. No acute bony abnormality is noted. IMPRESSION: Cervical fusion. Electronically Signed   By: Inez Catalina M.D.   On: 03/23/2018 14:18   Dg C-arm 1-60 Min  Result Date: 03/23/2018 CLINICAL DATA:  Cervical fusion EXAM: DG C-ARM 61-120 MIN; CERVICAL SPINE - 2-3 VIEW COMPARISON:  None. FLUOROSCOPY TIME:  Fluoroscopy Time:  30 seconds Radiation Exposure Index (if provided by the fluoroscopic device): Not available Number of Acquired Spot Images: 2 FINDINGS: Interbody fusion is noted at C4-5 and C5-6 with anterior fixation. No acute bony abnormality is noted. IMPRESSION: Cervical fusion. Electronically Signed   By: Inez Catalina M.D.   On: 03/23/2018 14:18    Disposition:  Pt should report to clinic in 2 weeks for wound check Post op medications provided on chart Discharge Instructions    Incentive spirometry RT   Complete by:  As directed       Follow-up Information    Melina Schools, MD. Schedule an appointment as soon as possible for a visit in  2 weeks.   Specialty:  Orthopedic Surgery Why:  If symptoms worsen, For suture removal, For wound re-check Contact information: [address] [place] [address] [phone]            Signed: Valinda Hoar 03/27/2018, 1:14 PM

## 2018-03-27 NOTE — Progress Notes (Signed)
Occupational Therapy Treatment Patient Details Name: Gerald Rogers MRN: [id number] DOB: January 07, 1961 Today's Date: 03/27/2018    History of present illness Pt is a 57 y.o. male s/p C4-6 ACDF. PMH: HTN.   OT comments  Pt still demonstrates significant balance deficits at this time.  He needs mod to max assist for simulated toilet transfers, grooming in standing, and functional mobility.  Frequent LOB with gait noted in the hallway with only hand held assist.  Pt with awareness of cervical precautions verbally, but needs mod instructional cueing to follow them when brace was removed for demonstration of changing the pads.  Feel he will need follow-up CIR level therapy prior to discharge.    Follow Up Recommendations  Supervision/Assistance - 24 hour;CIR    Equipment Recommendations  Other (comment)(TBD next venue of care)    Recommendations for Other Services Rehab consult    Precautions / Restrictions Precautions Precautions: Fall;Cervical Precaution Comments: Reviewed cervical precautions. Required Braces or Orthoses: Cervical Brace Cervical Brace: Hard collar;Other (comment)(Per orders can be removed in bed) Restrictions Weight Bearing Restrictions: No       Mobility Bed Mobility Overal bed mobility: Needs Assistance Bed Mobility: Supine to Sit;Sit to Supine     Supine to sit: Supervision Sit to supine: Supervision      Transfers Overall transfer level: Needs assistance Equipment used: 1 person hand held assist Transfers: Sit to/from Stand Sit to Stand: Max assist         General transfer comment: LOB in all directions with mobility, requiring max assist for correction and safety.      Balance Overall balance assessment: Needs assistance;History of Falls Sitting-balance support: Feet supported;No upper extremity supported Sitting balance-Leahy Scale: Good     Standing balance support: During functional activity Standing balance-Leahy Scale: Zero Standing  balance comment: max assist to maintain balance with functional mobility                           ADL either performed or assessed with clinical judgement   ADL Overall ADL's : Needs assistance/impaired     Grooming: Oral care;Minimal assistance;Standing               Lower Body Dressing: Minimal assistance;Sit to/from stand   Toilet Transfer: Ambulation;Maximal assistance           Functional mobility during ADLs: Maximal assistance General ADL Comments: Pt required max assist hand held for simulated toilet transfers and for functional mobility in the hallway.  Multiple losses of balance in all directions noted.  Pt able to state no rotation or flexion for cervical precautions, however when brace was removed in order for therapist to demonstrate replacement of cervical pads, he rotated and flexed his neck.  Continued wearing cervical collar in bed secondary to decreased awareness of precautions at end of session.                 Cognition Arousal/Alertness: Awake/alert Behavior During Therapy: Impulsive Overall Cognitive Status: Impaired/Different from baseline                                 General Comments: Pt oriented to time, place, and situation.  He could also state cervical precautions of no rotation and flexion, but when brace was removed to demonstrate change of pads he was noted rotating and flexion his neck.  Brace re-applied for safety.  Pertinent Vitals/ Pain       Pain Assessment: Faces Faces Pain Scale: Hurts a little bit Pain Location: neck Pain Descriptors / Indicators: Discomfort Pain Intervention(s): Monitored during session         Frequency  Min 2X/week        Progress Toward Goals  OT Goals(current goals can now be found in the care plan section)  Progress towards OT goals: Progressing toward goals     Plan Discharge plan remains appropriate    Co-evaluation                  AM-PAC PT "6 Clicks" Daily Activity     Outcome Measure   Help from another person eating meals?: None Help from another person taking care of personal grooming?: A Little Help from another person toileting, which includes using toliet, bedpan, or urinal?: A Lot Help from another person bathing (including washing, rinsing, drying)?: A Lot Help from another person to put on and taking off regular upper body clothing?: A Little Help from another person to put on and taking off regular lower body clothing?: A Lot 6 Click Score: 16    End of Session Equipment Utilized During Treatment: Gait belt;Cervical collar  OT Visit Diagnosis: Unsteadiness on feet (R26.81);Muscle weakness (generalized) (M62.81);Other abnormalities of gait and mobility (R26.89)   Activity Tolerance Patient tolerated treatment well   Patient Left in bed;with call bell/phone within reach;with bed alarm set;with family/visitor present   Nurse Communication Mobility status;Other (comment)    Functional Assessment Tool Used: Clinical judgement Functional Limitation: Self care Self Care Current Status (V3710): At least 40 percent but less than 60 percent impaired, limited or restricted Self Care Goal Status (G2694): At least 1 percent but less than 20 percent impaired, limited or restricted   Time: 1101-1125 OT Time Calculation (min): 24 min  Charges: OT G-codes **NOT FOR INPATIENT CLASS** Functional Assessment Tool Used: Clinical judgement Functional Limitation: Self care Self Care Current Status (W5462): At least 40 percent but less than 60 percent impaired, limited or restricted Self Care Goal Status (V0350): At least 1 percent but less than 20 percent impaired, limited or restricted OT General Charges $OT Visit: 1 Visit OT Treatments $Self Care/Home Management : 23-37 mins   Yachet Mattson OTR/L 03/27/2018, 11:45 AM

## 2018-03-28 DIAGNOSIS — Z823 Family history of stroke: Secondary | ICD-10-CM | POA: Diagnosis not present

## 2018-03-28 DIAGNOSIS — E785 Hyperlipidemia, unspecified: Secondary | ICD-10-CM | POA: Diagnosis present

## 2018-03-28 DIAGNOSIS — I10 Essential (primary) hypertension: Secondary | ICD-10-CM | POA: Diagnosis present

## 2018-03-28 DIAGNOSIS — Z888 Allergy status to other drugs, medicaments and biological substances status: Secondary | ICD-10-CM | POA: Diagnosis not present

## 2018-03-28 DIAGNOSIS — M4712 Other spondylosis with myelopathy, cervical region: Secondary | ICD-10-CM | POA: Diagnosis present

## 2018-03-28 DIAGNOSIS — Z809 Family history of malignant neoplasm, unspecified: Secondary | ICD-10-CM | POA: Diagnosis not present

## 2018-03-28 DIAGNOSIS — Z8249 Family history of ischemic heart disease and other diseases of the circulatory system: Secondary | ICD-10-CM | POA: Diagnosis not present

## 2018-03-28 DIAGNOSIS — J449 Chronic obstructive pulmonary disease, unspecified: Secondary | ICD-10-CM | POA: Diagnosis present

## 2018-03-28 NOTE — Progress Notes (Signed)
    Subjective: 5 Days Post-Op Procedure(s) (LRB): ANTERIOR CERVICAL DECOMPRESSION/DISCECTOMY FUSION CERVICAL FOUR-FIVE, CERVICAL FIVE-SIX (N/A) Patient reports pain as 0 on 0-10 scale.   Denies CP or SOB.  Voiding without difficulty. Positive BM. Objective: Vital signs in last 24 hours: Temp:  [98.1 F (36.7 C)-98.7 F (37.1 C)] 98.4 F (36.9 C) (04/16 1213) Pulse Rate:  [63-74] 69 (04/16 1213) Resp:  [16-20] 17 (04/16 1213) BP: (90-120)/(61-91) 90/69 (04/16 1213) SpO2:  [92 %-96 %] 94 % (04/16 1213)  Intake/Output from previous day: No intake/output data recorded. Intake/Output this shift: No intake/output data recorded.  Labs: No results for input(s): HGB in the last 72 hours. No results for input(s): WBC, RBC, HCT, PLT in the last 72 hours. No results for input(s): NA, K, CL, CO2, BUN, CREATININE, GLUCOSE, CALCIUM in the last 72 hours. No results for input(s): LABPT, INR in the last 72 hours.  Physical Exam: Neurologically intact ABD soft Sensation intact distally Dorsiflexion/Plantar flexion intact Incision: no drainage Compartment soft Body mass index is 21.9 kg/m. Aspen collar in place  Assessment/Plan: 5 Days Post-Op Procedure(s) (LRB): ANTERIOR CERVICAL DECOMPRESSION/DISCECTOMY FUSION CERVICAL FOUR-FIVE, CERVICAL FIVE-SIX (N/A) Pt has been up with PT SW continues to work on placement with Rehab hopefully today Discharge summary done Post op med scripts on chart  Edmund Rick, Darla Lesches for Dr. Melina Schools [facility] [phone] 03/28/2018, 12:39 PM

## 2018-03-28 NOTE — Progress Notes (Signed)
Patient will discharge to [facility] Anticipated discharge date: 4/16 Family notified: pt sister at bedside Transportation by family Report #: [phone] Rm [id number]  CSW signing off.  Jorge Ny, LCSW Clinical Social Worker [phone]

## 2018-03-28 NOTE — Progress Notes (Signed)
CSW spoke with [facility] this morning they continue to work on getting workers comp approval for admission today  Jorge Ny, [place] Social Worker [phone]

## 2018-03-28 NOTE — Progress Notes (Signed)
Physical Therapy Treatment Patient Details Name: Gerald Rogers MRN: [id number] DOB: 03-04-1961 Today's Date: 03/28/2018    History of Present Illness Pt is a 57 y.o. male s/p C4-6 ACDF. PMH: HTN.    PT Comments    Patient is making progress toward mobility goals. Pt demonstrated improved sequencing of gait with use of rollator vs RW. Pt continues to demonstrate balance and cognitive deficits as well as decreased activity tolerance. Current plan remains appropriate.      Follow Up Recommendations  CIR;Supervision/Assistance - 24 hour(SNF if does not qualify for CIR)     Equipment Recommendations  None recommended by PT(pt reported having rollator at home)    Recommendations for Other Services       Precautions / Restrictions Precautions Precautions: Fall;Cervical Precaution Comments: Reviewed cervical precautions. Required Braces or Orthoses: Cervical Brace Cervical Brace: Hard collar;Other (comment)(Per orders can be removed in bed) Restrictions Weight Bearing Restrictions: No    Mobility  Bed Mobility Overal bed mobility: Needs Assistance Bed Mobility: Supine to Sit;Sit to Supine   Sidelying to sit: HOB elevated;Supervision   Sit to supine: Supervision   General bed mobility comments: cues for sequencing  Transfers Overall transfer level: Needs assistance Equipment used: (rollator) Transfers: Sit to/from Stand Sit to Stand: Min assist         General transfer comment: assist for balance; cues for safe hand placement  Ambulation/Gait Ambulation/Gait assistance: Min assist   Assistive device: (rollator) Gait Pattern/deviations: Trunk flexed;Step-through pattern;Drifts right/left     General Gait Details: cues for posture and safe cadence; pt with improved sequencing of gait with use of rollator vs RW   Stairs             Wheelchair Mobility    Modified Rankin (Stroke Patients Only)       Balance Overall balance assessment: Needs  assistance;History of Falls Sitting-balance support: Feet supported;No upper extremity supported Sitting balance-Leahy Scale: Good     Standing balance support: During functional activity;Bilateral upper extremity supported Standing balance-Leahy Scale: Poor Standing balance comment: reliant on bilat UE support                            Cognition Arousal/Alertness: Awake/alert Behavior During Therapy: Impulsive Overall Cognitive Status: No family/caregiver present to determine baseline cognitive functioning Area of Impairment: Memory;Following commands;Safety/judgement;Problem solving                     Memory: Decreased short-term memory Following Commands: Follows one step commands with increased time Safety/Judgement: Decreased awareness of deficits;Decreased awareness of safety   Problem Solving: Requires verbal cues General Comments: pt with brace off lying in bed upon arrival and required assistance to reapply brace given pt was attempting to put both pieces under his chin and reported the brace "is broken";pt needs cues to maintain cervical precautions although he is able to verbally recall them and continues to be impulsive despite reporting that he has balance deficits      Exercises General Exercises - Lower Extremity Long Arc Quad: AROM;Both;10 reps;Seated Hip Flexion/Marching: AROM;Both;10 reps;Standing Mini-Sqauts: 5 reps;Standing    General Comments        Pertinent Vitals/Pain Pain Assessment: Faces Faces Pain Scale: Hurts a little bit Pain Location: neck Pain Descriptors / Indicators: Discomfort Pain Intervention(s): Limited activity within patient's tolerance;Monitored during session;Repositioned    Home Living  Prior Function            PT Goals (current goals can now be found in the care plan section) Acute Rehab PT Goals Patient Stated Goal: Home after rehab PT Goal Formulation: With  patient/family Time For Goal Achievement: 04/07/18 Potential to Achieve Goals: Good Progress towards PT goals: Progressing toward goals    Frequency    Min 5X/week      PT Plan Current plan remains appropriate    Co-evaluation              AM-PAC PT "6 Clicks" Daily Activity  Outcome Measure  Difficulty turning over in bed (including adjusting bedclothes, sheets and blankets)?: A Little Difficulty moving from lying on back to sitting on the side of the bed? : A Little Difficulty sitting down on and standing up from a chair with arms (e.g., wheelchair, bedside commode, etc,.)?: Unable Help needed moving to and from a bed to chair (including a wheelchair)?: A Little Help needed walking in hospital room?: A Little Help needed climbing 3-5 steps with a railing? : A Little 6 Click Score: 16    End of Session Equipment Utilized During Treatment: Gait belt;Cervical collar Activity Tolerance: Patient tolerated treatment well Patient left: with call bell/phone within reach;in bed;with bed alarm set Nurse Communication: Mobility status PT Visit Diagnosis: Unsteadiness on feet (R26.81);Pain;Other symptoms and signs involving the nervous system (R29.898) Pain - part of body: (Neck)     Time: 4782-9562 PT Time Calculation (min) (ACUTE ONLY): 25 min  Charges:  $Gait Training: 8-22 mins $Therapeutic Exercise: 8-22 mins                    G Codes:       Gerald Rogers, PTA Pager: [phone]     Darliss Cheney 03/28/2018, 10:04 AM

## 2018-03-28 NOTE — Care Management Note (Signed)
Case Management Note  Patient Details  Name: JADIER ROCKERS MRN: [id number] Date of Birth: [birth year]  Subjective/Objective:                    Action/Plan: Awaiting placement in SNF under his workers comp. CM following for d/c disposition.  Expected Discharge Date:  (Pending)               Expected Discharge Plan:  Skilled Nursing Facility  In-House Referral:  Clinical Social Work  Discharge planning Services     Post Acute Care Choice:    Choice offered to:     DME Arranged:    DME Agency:     HH Arranged:    [place] Agency:     Status of Service:  In process, will continue to follow  If discussed at Long Length of Stay Meetings, dates discussed:    Additional Comments:  Pollie Friar, RN 03/28/2018, 12:39 PM

## 2018-03-29 DIAGNOSIS — Q288 Other specified congenital malformations of circulatory system: Secondary | ICD-10-CM | POA: Diagnosis not present

## 2018-03-29 DIAGNOSIS — F419 Anxiety disorder, unspecified: Secondary | ICD-10-CM | POA: Diagnosis not present

## 2018-03-29 DIAGNOSIS — G959 Disease of spinal cord, unspecified: Secondary | ICD-10-CM | POA: Diagnosis not present

## 2018-03-29 DIAGNOSIS — I10 Essential (primary) hypertension: Secondary | ICD-10-CM | POA: Diagnosis not present

## 2018-03-30 NOTE — Consult Note (Signed)
            [facility] CM Primary Care Navigator  03/30/2018  Gerald Rogers 02-07-1961 [id number]  Attempt to see patient at the bedside to identify possible discharge needs but he was already discharged per staff. Per chart review, patient was discharged to skilled nursing facility under his workers' comp ([place]).  Per MD note, patient was admitted with a diagnosis of Cervical Myelopathy and underwent Anterior Cervical Decompression/ Discectomy fusion C4-6 on 03/23/18.  Patient has discharge instruction to follow-up with orthopedic surgeon (Dr. Amador Cunas) in 2 weeks (for suture removal, for wound re-check).   For additional questions please contact:  Edwena Felty A. Fredda Clarida, BSN, RN-BC [facility] PRIMARY CARE Navigator Cell: [phone]

## 2018-04-16 NOTE — Anesthesia Postprocedure Evaluation (Signed)
Anesthesia Post Note  Patient: Gerald Rogers  Procedure(s) Performed: ANTERIOR CERVICAL DECOMPRESSION/DISCECTOMY FUSION CERVICAL FOUR-FIVE, CERVICAL FIVE-SIX (N/A Spine Cervical)     Patient location during evaluation: PACU Anesthesia Type: General Level of consciousness: awake and sedated Pain management: pain level controlled Vital Signs Assessment: post-procedure vital signs reviewed and stable Respiratory status: spontaneous breathing, nonlabored ventilation, respiratory function stable and patient connected to nasal cannula oxygen Cardiovascular status: blood pressure returned to baseline and stable Postop Assessment: no apparent nausea or vomiting Anesthetic complications: no    Last Vitals:  Vitals:   03/28/18 0811 03/28/18 1213  BP: 106/68 90/69  Pulse: 63 69  Resp: 17 17  Temp: 37.1 C 36.9 C  SpO2: 94% 94%    Last Pain:  Vitals:   03/28/18 1213  TempSrc: Oral  PainSc:                  Charne Mcbrien,JAMES TERRILL

## 2018-07-01 ENCOUNTER — Other Ambulatory Visit: Payer: Self-pay | Admitting: Family Medicine

## 2018-07-01 DIAGNOSIS — E785 Hyperlipidemia, unspecified: Secondary | ICD-10-CM

## 2018-07-27 ENCOUNTER — Telehealth: Payer: Self-pay

## 2018-07-27 NOTE — Telephone Encounter (Signed)
Dr Rolena Infante wanted to know if Celebrex is compatible with his blood pressure medication, Per patient's sister.    [phone]

## 2018-07-28 NOTE — Telephone Encounter (Signed)
Left message advising

## 2018-07-28 NOTE — Telephone Encounter (Signed)
Celebrex will not interfere with his blood pressure medication but Celebrex is technically an NSAID and all NSAIDs can potentially raise blood pressure.  So he will just have to keep an eye and see if while taking the Celebrex if his blood pressure tends to run higher.  Ultimately that the potential side effect with all the NSAIDs.

## 2018-08-01 NOTE — Telephone Encounter (Signed)
Patient's sister advised.

## 2018-08-24 ENCOUNTER — Ambulatory Visit (INDEPENDENT_AMBULATORY_CARE_PROVIDER_SITE_OTHER): Payer: Medicare Other | Admitting: Family Medicine

## 2018-08-24 ENCOUNTER — Encounter: Payer: Self-pay | Admitting: Family Medicine

## 2018-08-24 VITALS — BP 95/57 | HR 56 | Ht 64.0 in | Wt 124.0 lb

## 2018-08-24 DIAGNOSIS — G8929 Other chronic pain: Secondary | ICD-10-CM | POA: Diagnosis not present

## 2018-08-24 DIAGNOSIS — M545 Low back pain: Secondary | ICD-10-CM | POA: Diagnosis not present

## 2018-08-24 DIAGNOSIS — E7800 Pure hypercholesterolemia, unspecified: Secondary | ICD-10-CM | POA: Diagnosis not present

## 2018-08-24 DIAGNOSIS — Z981 Arthrodesis status: Secondary | ICD-10-CM

## 2018-08-24 DIAGNOSIS — I10 Essential (primary) hypertension: Secondary | ICD-10-CM | POA: Diagnosis not present

## 2018-08-24 DIAGNOSIS — Z23 Encounter for immunization: Secondary | ICD-10-CM | POA: Diagnosis not present

## 2018-08-24 DIAGNOSIS — M542 Cervicalgia: Secondary | ICD-10-CM

## 2018-08-24 MED ORDER — PRAVASTATIN SODIUM 20 MG PO TABS: 20.0000 mg | ORAL_TABLET | Freq: Every day | ORAL | 3 refills | Status: DC

## 2018-08-24 MED ORDER — LOSARTAN POTASSIUM-HCTZ 50-12.5 MG PO TABS: 1.0000 | ORAL_TABLET | Freq: Every day | ORAL | 0 refills | Status: DC

## 2018-08-24 MED ORDER — CELECOXIB 100 MG PO CAPS: 100.0000 mg | ORAL_CAPSULE | Freq: Two times a day (BID) | ORAL | 1 refills | Status: DC | PRN

## 2018-08-24 MED ORDER — CYCLOBENZAPRINE HCL 10 MG PO TABS: 10.0000 mg | ORAL_TABLET | Freq: Every evening | ORAL | 0 refills | Status: DC | PRN

## 2018-08-24 NOTE — Progress Notes (Signed)
Subjective:    CC: BP  HPI:  57 year old male is here today for follow-up of his blood pressures.  Since I last saw him he actually had surgery with Dr. Melina Schools for cervical myelopathy.  He had an anterior cervical decompression and discectomy with cervical fusion at 4 and 5 and 5 6.  He unfortunately was hospitalized for a little longer than he was hoping but he is doing much better.  He is going to physical therapy though not consistently but he has been going for aquatic therapy as well as for vertigo.  He does need something for his back pain as he still having some discomfort.  They talked about putting him on Celebrex but a prescription was never sent to his pharmacy.  Is been taking a little bit of the leftover hydrocodone from his surgery.  Now off of his gout Lamictal and gabapentin and doing well so far.  He has been tapering off those over the last several weeks.  Is been getting some spasms in his back with movement.  He does use a walker at home but still has significant gait difficulty he is not able to go up and down stairs by himself without assistance.  Today he is in a wheelchair.  Hypertension- Pt denies chest pain, SOB, dizziness, or heart palpitations.  Taking meds as directed w/o problems.  Denies medication side effects.    Hyperlipidemia-currently on pravastatin 20 mg daily.  Tolerating well without any significant side effects or problems.  Past medical history, Surgical history, Family history not pertinant except as noted below, Social history, Allergies, and medications have been entered into the medical record, reviewed, and corrections made.   Review of Systems: No fevers, chills, night sweats, weight loss, chest pain, or shortness of breath.   Objective:    General: Well Developed, well nourished, and in no acute distress.  Neuro: Alert and oriented x3, extra-ocular muscles intact, sensation grossly intact.  HEENT: Normocephalic, atraumatic  Skin: Warm and  dry, no rashes. Cardiac: Regular rate and rhythm, no murmurs rubs or gallops, no lower extremity edema.  Respiratory: Clear to auscultation bilaterally. Not using accessory muscles, speaking in full sentences.   Impression and Recommendations:    HTN -well controlled.  Will decrease HCTZ to 50/12.5 which is essentially cutting his dose in half.  Follow-up in 1 month for nurse visit for blood pressure check.  Still low at that point in time that we might even be able to discontinue the medication completely.  Hyperipidemia-due to repeat lipid panel.  Will call with results once available.  Chronic back and neck pain status post cervical fusion.  We will send her prescription for Celebrex.  As well as a trial for muscle relaxer. Says baclofen wasn't helpful.

## 2018-08-25 LAB — BASIC METABOLIC PANEL WITH GFR
BUN: 20 mg/dL (ref 7–25)
CALCIUM: 10.2 mg/dL (ref 8.6–10.3)
CHLORIDE: 95 mmol/L — AB (ref 98–110)
CO2: 30 mmol/L (ref 20–32)
Creat: 1.16 mg/dL (ref 0.70–1.33)
GFR, Est African American: 80 mL/min/{1.73_m2} (ref >=60)
GFR, Est Non African American: 69 mL/min/{1.73_m2} (ref >=60)
Glucose, Bld: 84 mg/dL (ref 65–99)
POTASSIUM: 3.7 mmol/L (ref 3.5–5.3)
Sodium: 136 mmol/L (ref 135–146)

## 2018-09-14 ENCOUNTER — Ambulatory Visit (INDEPENDENT_AMBULATORY_CARE_PROVIDER_SITE_OTHER): Payer: Medicare Other | Admitting: Family Medicine

## 2018-09-14 VITALS — BP 101/68 | HR 64 | Temp 98.2°F

## 2018-09-14 DIAGNOSIS — I10 Essential (primary) hypertension: Secondary | ICD-10-CM

## 2018-09-14 NOTE — Progress Notes (Signed)
Call pt: Blood pressure actually looks really good and is actually in fact still low.  We will have him completely stop the losartan HCTZ and follow-up in about 3 weeks for nurse visit again just to make sure blood pressure looks good off of the medication.  We will keep the propranolol for now.  Beatrice Lecher, MD

## 2018-09-14 NOTE — Progress Notes (Signed)
Left a detailed vm msg for pt regarding provider's note & recommendation. Direct call back info provided.  

## 2018-09-14 NOTE — Progress Notes (Signed)
Pt is here for bp check. Denies lightheadedness, SOB, chest pain, palpitations or dizziness. As per pt, still feeling good since reduction in bp med. Bp reading for today was 101/68, pulse 64. Pt wants to know if he is to continue taking his bp med. Pt stated that at last office visit provider mention if bp continues to be low, med will be stopped. Pt is requesting med refill. Pt is aware we will call with any updates or changes.

## 2018-10-16 ENCOUNTER — Other Ambulatory Visit: Payer: Self-pay | Admitting: Family Medicine

## 2018-10-16 ENCOUNTER — Ambulatory Visit (INDEPENDENT_AMBULATORY_CARE_PROVIDER_SITE_OTHER): Payer: Medicare Other | Admitting: Family Medicine

## 2018-10-16 VITALS — BP 124/74 | HR 59

## 2018-10-16 DIAGNOSIS — I10 Essential (primary) hypertension: Secondary | ICD-10-CM | POA: Diagnosis not present

## 2018-10-16 NOTE — Progress Notes (Signed)
Pt came into clinic today for BP check. At last OV he was advised to discontinue the losartan-HCTZ Rx. Denies any chest pain, dizziness, palpations. He did so, but then he started checking his BP at home and it was up in the 569'V systolic. He restarte the losartan-HCTZ 50-12.5mg . BP today at goal. Will route to PCP for review.  Vitals:   10/16/18 1535  BP: 124/74  Pulse: (!) 59

## 2018-10-17 MED ORDER — LOSARTAN POTASSIUM-HCTZ 50-12.5 MG PO TABS: 1.0000 | ORAL_TABLET | Freq: Every day | ORAL | 3 refills | Status: DC

## 2018-10-17 NOTE — Progress Notes (Signed)
OK, continue with the medication. Thank you!  lOoks great!

## 2018-10-17 NOTE — Progress Notes (Signed)
Pt advised. He does need a refill. Rx sent.

## 2018-10-17 NOTE — Addendum Note (Signed)
Addended by: Lavon Paganini on: 10/17/2018 09:37 AM   Modules accepted: Orders

## 2018-10-18 ENCOUNTER — Other Ambulatory Visit: Payer: Self-pay | Admitting: Family Medicine

## 2018-12-25 DIAGNOSIS — S61215A Laceration without foreign body of left ring finger without damage to nail, initial encounter: Secondary | ICD-10-CM | POA: Diagnosis not present

## 2019-02-22 ENCOUNTER — Encounter: Payer: Self-pay | Admitting: Family Medicine

## 2019-02-22 ENCOUNTER — Ambulatory Visit: Payer: Self-pay | Admitting: Family Medicine

## 2019-02-22 ENCOUNTER — Other Ambulatory Visit: Payer: Self-pay

## 2019-02-22 ENCOUNTER — Ambulatory Visit (INDEPENDENT_AMBULATORY_CARE_PROVIDER_SITE_OTHER): Payer: Medicare Other | Admitting: Family Medicine

## 2019-02-22 VITALS — BP 129/63 | HR 54 | Ht 64.0 in | Wt 140.0 lb

## 2019-02-22 DIAGNOSIS — Z125 Encounter for screening for malignant neoplasm of prostate: Secondary | ICD-10-CM

## 2019-02-22 DIAGNOSIS — Z720 Tobacco use: Secondary | ICD-10-CM

## 2019-02-22 DIAGNOSIS — E7800 Pure hypercholesterolemia, unspecified: Secondary | ICD-10-CM

## 2019-02-22 DIAGNOSIS — Z1159 Encounter for screening for other viral diseases: Secondary | ICD-10-CM | POA: Diagnosis not present

## 2019-02-22 DIAGNOSIS — I10 Essential (primary) hypertension: Secondary | ICD-10-CM | POA: Diagnosis not present

## 2019-02-22 NOTE — Progress Notes (Signed)
Subjective:    CC: BP check up.   HPI:  Hypertension- Pt denies chest pain, SOB, dizziness, or heart palpitations.  Taking meds as directed w/o problems.  Denies medication side effects.    Hyperlipidemia-currently on pravastatin.  Tolerating well with any side effects or problems.  Due for repeat lipid panel and liver enzyme check.  Tobacco abuse-he says he is no longer smoking but is still vaping.  Past medical history, Surgical history, Family history not pertinant except as noted below, Social history, Allergies, and medications have been entered into the medical record, reviewed, and corrections made.   Review of Systems: No fevers, chills, night sweats, weight loss, chest pain, or shortness of breath.   Objective:    General: Well Developed, well nourished, and in no acute distress.  Neuro: Alert and oriented x3, extra-ocular muscles intact, sensation grossly intact.  HEENT: Normocephalic, atraumatic  Skin: Warm and dry, no rashes. Cardiac: Regular rate and rhythm, no murmurs rubs or gallops, no lower extremity edema.  Respiratory: Clear to auscultation bilaterally. Not using accessory muscles, speaking in full sentences.   Impression and Recommendations:    HTN - Well controlled. Continue current regimen. Follow up in  6 months.    Hyperlipidemia-due to recheck lipids and liver enzymes.  Tobacco abuse-encourage smoking cessation today.  Discussed going ahead and getting a PSA as well as hepatitis C screen since he is getting some up-to-date blood work.

## 2019-02-23 LAB — COMPLETE METABOLIC PANEL WITH GFR
AG Ratio: 1.5 (calc) (ref 1.0–2.5)
ALBUMIN MSPROF: 4.1 g/dL (ref 3.6–5.1)
ALT: 32 U/L (ref 9–46)
AST: 22 U/L (ref 10–35)
Alkaline phosphatase (APISO): 65 U/L (ref 35–144)
BUN: 16 mg/dL (ref 7–25)
CALCIUM: 9.5 mg/dL (ref 8.6–10.3)
CO2: 28 mmol/L (ref 20–32)
CREATININE: 0.9 mg/dL (ref 0.70–1.33)
Chloride: 98 mmol/L (ref 98–110)
GFR, EST AFRICAN AMERICAN: 108 mL/min/{1.73_m2} (ref >=60)
GFR, EST NON AFRICAN AMERICAN: 93 mL/min/{1.73_m2} (ref >=60)
GLUCOSE: 95 mg/dL (ref 65–99)
Globulin: 2.7 g/dL (calc) (ref 1.9–3.7)
Potassium: 4.1 mmol/L (ref 3.5–5.3)
SODIUM: 135 mmol/L (ref 135–146)
TOTAL PROTEIN: 6.8 g/dL (ref 6.1–8.1)
Total Bilirubin: 0.4 mg/dL (ref 0.2–1.2)

## 2019-02-23 LAB — LIPID PANEL
CHOLESTEROL: 137 mg/dL (ref <200)
HDL: 40 mg/dL (ref >=40)
LDL Cholesterol (Calc): 72 mg/dL (calc)
Non-HDL Cholesterol (Calc): 97 mg/dL (calc) (ref <130)
Total CHOL/HDL Ratio: 3.4 (calc) (ref <5.0)
Triglycerides: 174 mg/dL — ABNORMAL HIGH (ref <150)

## 2019-02-23 LAB — PSA: PSA: 0.8 ng/mL (ref <=4.0)

## 2019-02-23 LAB — HEPATITIS C ANTIBODY
HEP C AB: NONREACTIVE
SIGNAL TO CUT-OFF: 0.1 (ref <1.00)

## 2019-06-07 ENCOUNTER — Other Ambulatory Visit: Payer: Self-pay | Admitting: Family Medicine

## 2019-08-27 ENCOUNTER — Encounter: Payer: Self-pay | Admitting: Family Medicine

## 2019-08-27 ENCOUNTER — Other Ambulatory Visit: Payer: Self-pay

## 2019-08-27 ENCOUNTER — Ambulatory Visit (INDEPENDENT_AMBULATORY_CARE_PROVIDER_SITE_OTHER): Payer: Medicare Other | Admitting: Family Medicine

## 2019-08-27 VITALS — BP 117/87 | HR 65 | Temp 98.2°F | Ht 64.0 in | Wt 141.1 lb

## 2019-08-27 DIAGNOSIS — Z79899 Other long term (current) drug therapy: Secondary | ICD-10-CM | POA: Diagnosis not present

## 2019-08-27 DIAGNOSIS — Z23 Encounter for immunization: Secondary | ICD-10-CM | POA: Diagnosis not present

## 2019-08-27 DIAGNOSIS — I10 Essential (primary) hypertension: Secondary | ICD-10-CM

## 2019-08-27 NOTE — Progress Notes (Signed)
Established Patient Office Visit  Subjective:  Patient ID: Gerald Rogers, male    DOB: 1961-03-03  Age: 58 y.o. MRN: [id number]  CC:  Chief Complaint  Patient presents with  . Follow-up    HPI Gerald Rogers presents for   Hypertension- Pt denies chest pain, SOB, dizziness, or heart palpitations.  Taking meds as directed w/o problems.  Denies medication side effects.      He did have a question about his propranolol.  1 of his providers had asked him why he was taking it he was not really sure.  He says he knows he is been on it for at least 2 years but definitely a lot longer.  He is not exactly sure why it was started he thinks it might of been for headaches but is just not sure.  Past Medical History:  Diagnosis Date  . Anxiety   . Arteriovenous fistula of spinal cord vessels   . Arthritis   . Headache   . Hyperlipidemia   . Hypertension     Past Surgical History:  Procedure Laterality Date  . angiogram spinal    . ANTERIOR CERVICAL DECOMP/DISCECTOMY FUSION N/A 03/23/2018   Procedure: ANTERIOR CERVICAL DECOMPRESSION/DISCECTOMY FUSION CERVICAL FOUR-FIVE, CERVICAL FIVE-SIX;  Surgeon: Melina Schools, MD;  Location: [place];  Service: Orthopedics;  Laterality: N/A;  3 hrs  . EYE SURGERY     removal of foreign body  . VAGAL NERVE STIMULATOR REMOVAL  09/2015   Dr. Gloriann Loan     Family History  Problem Relation Age of Onset  . Heart disease Mother 73  . Hypertension Mother   . Cancer Father   . Stroke Maternal Aunt     Social History   Socioeconomic History  . Marital status: Married    Spouse name: Not on file  . Number of children: Not on file  . Years of education: Not on file  . Highest education level: Not on file  Occupational History  . Not on file  Social Needs  . Financial resource strain: Not on file  . Food insecurity    Worry: Not on file    Inability: Not on file  . Transportation needs    Medical: Not on file    Non-medical: Not on file  Tobacco  Use  . Smoking status: Former Smoker    Packs/day: 1.00    Types: Cigarettes, E-cigarettes    Start date: 12/13/1970    Quit date: 02/11/2018    Years since quitting: 1.5  . Smokeless tobacco: Current User  Substance and Sexual Activity  . Alcohol use: Yes    Alcohol/week: 0.0 standard drinks  . Drug use: No  . Sexual activity: Yes    Partners: Female  Lifestyle  . Physical activity    Days per week: Not on file    Minutes per session: Not on file  . Stress: Not on file  Relationships  . Social Herbalist on phone: Not on file    Gets together: Not on file    Attends religious service: Not on file    Active member of club or organization: Not on file    Attends meetings of clubs or organizations: Not on file    Relationship status: Not on file  . Intimate partner violence    Fear of current or ex partner: Not on file    Emotionally abused: Not on file    Physically abused: Not on file  Forced sexual activity: Not on file  Other Topics Concern  . Not on file  Social History Narrative   No regular exercise.      Outpatient Medications Prior to Visit  Medication Sig Dispense Refill  . aspirin 325 MG tablet Take 325 mg by mouth daily.    Marland Kitchen losartan-hydrochlorothiazide (HYZAAR) 50-12.5 MG tablet TAKE 1 TABLET BY MOUTH EVERY DAY 90 tablet 1  . pravastatin (PRAVACHOL) 20 MG tablet Take 1 tablet (20 mg total) by mouth daily. 90 tablet 3  . propranolol ER (INDERAL LA) 80 MG 24 hr capsule Take 80 mg by mouth daily.      No facility-administered medications prior to visit.     Allergies  Allergen Reactions  . Cymbalta [Duloxetine Hcl] Other (See Comments)    Generalized weakness and mental status changes  . Klonopin [Clonazepam] Other (See Comments)    Mental status changes    ROS Review of Systems    Objective:    Physical Exam  BP 117/87   Pulse 65   Temp 98.2 F (36.8 C) (Oral)   Ht 5\' 4"  (1.626 m)   Wt 141 lb 1.9 oz (64 kg)   BMI 24.22 kg/m  Wt  Readings from Last 3 Encounters:  08/27/19 141 lb 1.9 oz (64 kg)  02/22/19 140 lb (63.5 kg)  08/24/18 124 lb (56.2 kg)     There are no preventive care reminders to display for this patient.  There are no preventive care reminders to display for this patient.  No results found for: TSH Lab Results  Component Value Date   WBC 10.7 (H) 03/20/2018   HGB 15.8 03/20/2018   HCT 45.3 03/20/2018   MCV 93.6 03/20/2018   PLT 270 03/20/2018   Lab Results  Component Value Date   NA 135 02/22/2019   K 4.1 02/22/2019   CO2 28 02/22/2019   GLUCOSE 95 02/22/2019   BUN 16 02/22/2019   CREATININE 0.90 02/22/2019   BILITOT 0.4 02/22/2019   ALKPHOS 87 02/15/2017   AST 22 02/22/2019   ALT 32 02/22/2019   PROT 6.8 02/22/2019   ALBUMIN 4.2 02/15/2017   CALCIUM 9.5 02/22/2019   ANIONGAP 13 03/20/2018   Lab Results  Component Value Date   CHOL 137 02/22/2019   Lab Results  Component Value Date   HDL 40 02/22/2019   Lab Results  Component Value Date   LDLCALC 72 02/22/2019   Lab Results  Component Value Date   TRIG 174 (H) 02/22/2019   Lab Results  Component Value Date   CHOLHDL 3.4 02/22/2019   No results found for: HGBA1C    Assessment & Plan:   Problem List Items Addressed This Visit      Cardiovascular and Mediastinum   Benign essential hypertension    Well controlled. Continue current regimen. Follow up in  6 months.  Okay to discontinue propranolol and will keep an eye on the blood pressure.  Can follow-up for nurse visit or if he is being seen by another provider that is perfectly fine otherwise I will see him back in 6 months.  Due for BMP today.      Relevant Orders   BASIC METABOLIC PANEL WITH GFR    Other Visit Diagnoses    Medication management    -  Primary   Need for influenza vaccination       Relevant Orders   Flu Vaccine QUAD 6+ mos PF IM (Fluarix Quad PF) (Completed)  Medication management-okay to discontinue the propranolol.  It sounds like  it may have been initially been started either for headache or possible tremor.  At this point I think it is worth a trial off of it but will need to monitor his blood pressure carefully normally it is really well controlled such to make sure it does not go uncontrolled off the medication.  No orders of the defined types were placed in this encounter.   Follow-up: Return in about 4 weeks (around 09/24/2019) for nurse visit for BP.  Otherwise 6 month follow up.  Beatrice Lecher, MD

## 2019-08-27 NOTE — Assessment & Plan Note (Addendum)
Well controlled. Continue current regimen. Follow up in  6 months.  Okay to discontinue propranolol and will keep an eye on the blood pressure.  Can follow-up for nurse visit or if he is being seen by another provider that is perfectly fine otherwise I will see him back in 6 months.  Due for BMP today.

## 2019-08-28 LAB — BASIC METABOLIC PANEL WITH GFR
BUN: 15 mg/dL (ref 7–25)
CO2: 30 mmol/L (ref 20–32)
Calcium: 10.3 mg/dL (ref 8.6–10.3)
Chloride: 96 mmol/L — ABNORMAL LOW (ref 98–110)
Creat: 0.88 mg/dL (ref 0.70–1.33)
GFR, Est African American: 109 mL/min/{1.73_m2} (ref >=60)
GFR, Est Non African American: 94 mL/min/{1.73_m2} (ref >=60)
Glucose, Bld: 96 mg/dL (ref 65–99)
Potassium: 4.4 mmol/L (ref 3.5–5.3)
Sodium: 134 mmol/L — ABNORMAL LOW (ref 135–146)

## 2019-09-25 ENCOUNTER — Other Ambulatory Visit: Payer: Self-pay

## 2019-09-25 ENCOUNTER — Ambulatory Visit (INDEPENDENT_AMBULATORY_CARE_PROVIDER_SITE_OTHER): Payer: Medicare Other | Admitting: Family Medicine

## 2019-09-25 VITALS — BP 130/69 | HR 76

## 2019-09-25 DIAGNOSIS — I10 Essential (primary) hypertension: Secondary | ICD-10-CM

## 2019-09-25 NOTE — Progress Notes (Signed)
Agree with documentation as above.   Makaveli Hoard, MD  

## 2019-09-25 NOTE — Progress Notes (Signed)
Patient presents to office today for blood pressure check. Patient is taking Losartan-HCTZ and has discontinued Propranolol per providers recommendation.   First blood pressure check was 139/82. I advised patient to relax for 5-10 minutes and then we will do recheck.   Patient not taking BP at home. Denies any chest pain, headaches, etc.   Recheck BP was 130/69. Patient advised to keep 6 month follow up with PCP and call with any needs.   Patient UTD on health maintenance.

## 2019-09-30 ENCOUNTER — Other Ambulatory Visit: Payer: Self-pay | Admitting: Family Medicine

## 2019-09-30 DIAGNOSIS — E7800 Pure hypercholesterolemia, unspecified: Secondary | ICD-10-CM

## 2019-10-09 ENCOUNTER — Other Ambulatory Visit: Payer: Self-pay | Admitting: Family Medicine

## 2020-02-25 ENCOUNTER — Other Ambulatory Visit: Payer: Self-pay

## 2020-02-25 ENCOUNTER — Ambulatory Visit (INDEPENDENT_AMBULATORY_CARE_PROVIDER_SITE_OTHER): Payer: Medicare Other | Admitting: Family Medicine

## 2020-02-25 ENCOUNTER — Encounter: Payer: Self-pay | Admitting: Family Medicine

## 2020-02-25 VITALS — BP 133/78 | HR 82 | Ht 64.0 in | Wt 145.0 lb

## 2020-02-25 DIAGNOSIS — Z125 Encounter for screening for malignant neoplasm of prostate: Secondary | ICD-10-CM | POA: Diagnosis not present

## 2020-02-25 DIAGNOSIS — E7800 Pure hypercholesterolemia, unspecified: Secondary | ICD-10-CM | POA: Diagnosis not present

## 2020-02-25 DIAGNOSIS — I10 Essential (primary) hypertension: Secondary | ICD-10-CM

## 2020-02-25 NOTE — Assessment & Plan Note (Signed)
Due to recheck lipids and liver enzymes. Doing well on pravastatin.

## 2020-02-25 NOTE — Assessment & Plan Note (Signed)
Well controlled. Continue current regimen. Follow up in  6 months.  

## 2020-02-25 NOTE — Progress Notes (Signed)
Established Patient Office Visit  Subjective:  Patient ID: Gerald Rogers, male    DOB: 1961/06/03  Age: 59 y.o. MRN: [id number]  CC:  Chief Complaint  Patient presents with  . Hypertension    HPI [name] presents for  Hypertension- Pt denies chest pain, SOB, dizziness, or heart palpitations.  Taking meds as directed w/o problems.  Denies medication side effects.    Hyperlipidemia - tolerating stating well with no myalgias or significant side effects.  Lab Results  Component Value Date   CHOL 137 02/22/2019   HDL 40 02/22/2019   LDLCALC 72 02/22/2019   TRIG 174 (H) 02/22/2019   CHOLHDL 3.4 02/22/2019    No family history of diabetes.   Past Medical History:  Diagnosis Date  . Anxiety   . Arteriovenous fistula of spinal cord vessels   . Arthritis   . Headache   . Hyperlipidemia   . Hypertension     Past Surgical History:  Procedure Laterality Date  . angiogram spinal    . ANTERIOR CERVICAL DECOMP/DISCECTOMY FUSION N/A 03/23/2018   Procedure: ANTERIOR CERVICAL DECOMPRESSION/DISCECTOMY FUSION CERVICAL FOUR-FIVE, CERVICAL FIVE-SIX;  Surgeon: Melina Schools, MD;  Location: [facility];  Service: Orthopedics;  Laterality: N/A;  3 hrs  . EYE SURGERY     removal of foreign body  . VAGAL NERVE STIMULATOR REMOVAL  09/2015   Dr. Gloriann Loan     Family History  Problem Relation Age of Onset  . Heart disease Mother 83  . Hypertension Mother   . Cancer Father   . Stroke Maternal Aunt     Social History   Socioeconomic History  . Marital status: Married    Spouse name: Not on file  . Number of children: Not on file  . Years of education: Not on file  . Highest education level: Not on file  Occupational History  . Not on file  Tobacco Use  . Smoking status: Former Smoker    Packs/day: 1.00    Types: Cigarettes, E-cigarettes    Start date: 12/13/1970    Quit date: 02/11/2018    Years since quitting: 2.0  . Smokeless tobacco: Current User  Substance and Sexual  Activity  . Alcohol use: Yes    Alcohol/week: 0.0 standard drinks  . Drug use: No  . Sexual activity: Yes    Partners: Female  Other Topics Concern  . Not on file  Social History Narrative   No regular exercise.     Social Determinants of Health   Financial Resource Strain:   . Difficulty of Paying Living Expenses:   Food Insecurity:   . Worried About Charity fundraiser in the Last Year:   . Arboriculturist in the Last Year:   Transportation Needs:   . Film/video editor (Medical):   Marland Kitchen Lack of Transportation (Non-Medical):   Physical Activity:   . Days of Exercise per Week:   . Minutes of Exercise per Session:   Stress:   . Feeling of Stress :   Social Connections:   . Frequency of Communication with Friends and Family:   . Frequency of Social Gatherings with Friends and Family:   . Attends Religious Services:   . Active Member of Clubs or Organizations:   . Attends Archivist Meetings:   Marland Kitchen Marital Status:   Intimate Partner Violence:   . Fear of Current or Ex-Partner:   . Emotionally Abused:   Marland Kitchen Physically Abused:   . Sexually  Abused:     Outpatient Medications Prior to Visit  Medication Sig Dispense Refill  . aspirin 325 MG tablet Take 325 mg by mouth daily.    Marland Kitchen losartan-hydrochlorothiazide (HYZAAR) 50-12.5 MG tablet TAKE 1 TABLET BY MOUTH EVERY DAY 90 tablet 1  . pravastatin (PRAVACHOL) 20 MG tablet TAKE 1 TABLET BY MOUTH EVERY DAY 90 tablet 3   No facility-administered medications prior to visit.    Allergies  Allergen Reactions  . Cymbalta [Duloxetine Hcl] Other (See Comments)    Generalized weakness and mental status changes  . Klonopin [Clonazepam] Other (See Comments)    Mental status changes    ROS Review of Systems    Objective:    Physical Exam  Constitutional: He is oriented to person, place, and time. He appears well-developed and well-nourished.  HENT:  Head: Normocephalic and atraumatic.  Cardiovascular: Normal rate,  regular rhythm and normal heart sounds.  Pulmonary/Chest: Effort normal and breath sounds normal.  Neurological: He is alert and oriented to person, place, and time.  Skin: Skin is warm and dry.  Psychiatric: He has a normal mood and affect. His behavior is normal.    BP 133/78   Pulse 82   Ht 5\' 4"  (1.626 m)   Wt 145 lb (65.8 kg)   SpO2 97%   BMI 24.89 kg/m  Wt Readings from Last 3 Encounters:  02/25/20 145 lb (65.8 kg)  08/27/19 141 lb 1.9 oz (64 kg)  02/22/19 140 lb (63.5 kg)     There are no preventive care reminders to display for this patient.  There are no preventive care reminders to display for this patient.  No results found for: TSH Lab Results  Component Value Date   WBC 10.7 (H) 03/20/2018   HGB 15.8 03/20/2018   HCT 45.3 03/20/2018   MCV 93.6 03/20/2018   PLT 270 03/20/2018   Lab Results  Component Value Date   NA 134 (L) 08/27/2019   K 4.4 08/27/2019   CO2 30 08/27/2019   GLUCOSE 96 08/27/2019   BUN 15 08/27/2019   CREATININE 0.88 08/27/2019   BILITOT 0.4 02/22/2019   ALKPHOS 87 02/15/2017   AST 22 02/22/2019   ALT 32 02/22/2019   PROT 6.8 02/22/2019   ALBUMIN 4.2 02/15/2017   CALCIUM 10.3 08/27/2019   ANIONGAP 13 03/20/2018   Lab Results  Component Value Date   CHOL 137 02/22/2019   Lab Results  Component Value Date   HDL 40 02/22/2019   Lab Results  Component Value Date   LDLCALC 72 02/22/2019   Lab Results  Component Value Date   TRIG 174 (H) 02/22/2019   Lab Results  Component Value Date   CHOLHDL 3.4 02/22/2019   No results found for: HGBA1C    Assessment & Plan:   Problem List Items Addressed This Visit      Cardiovascular and Mediastinum   Benign essential hypertension - Primary    Well controlled. Continue current regimen. Follow up in  6 months.       Relevant Orders   COMPLETE METABOLIC PANEL WITH GFR   Lipid panel   PSA     Other   Hyperlipidemia    Due to recheck lipids and liver enzymes. Doing well  on pravastatin.        Relevant Orders   COMPLETE METABOLIC PANEL WITH GFR   Lipid panel   PSA    Other Visit Diagnoses    Screening for prostate cancer  Relevant Orders   COMPLETE METABOLIC PANEL WITH GFR   Lipid panel   PSA      No orders of the defined types were placed in this encounter.   Follow-up: Return in about 6 months (around 08/27/2020) for Hypertension.    Beatrice Lecher, MD

## 2020-02-26 LAB — LIPID PANEL
Cholesterol: 141 mg/dL (ref <200)
HDL: 45 mg/dL (ref >=40)
LDL Cholesterol (Calc): 71 mg/dL (calc)
Non-HDL Cholesterol (Calc): 96 mg/dL (calc) (ref <130)
Total CHOL/HDL Ratio: 3.1 (calc) (ref <5.0)
Triglycerides: 178 mg/dL — ABNORMAL HIGH (ref <150)

## 2020-02-26 LAB — COMPLETE METABOLIC PANEL WITH GFR
AG Ratio: 1.3 (calc) (ref 1.0–2.5)
ALT: 24 U/L (ref 9–46)
AST: 18 U/L (ref 10–35)
Albumin: 4.1 g/dL (ref 3.6–5.1)
Alkaline phosphatase (APISO): 65 U/L (ref 35–144)
BUN: 13 mg/dL (ref 7–25)
CO2: 30 mmol/L (ref 20–32)
Calcium: 10.2 mg/dL (ref 8.6–10.3)
Chloride: 95 mmol/L — ABNORMAL LOW (ref 98–110)
Creat: 0.82 mg/dL (ref 0.70–1.25)
GFR, Est African American: 111 mL/min/{1.73_m2} (ref >=60)
GFR, Est Non African American: 96 mL/min/{1.73_m2} (ref >=60)
Globulin: 3.1 g/dL (calc) (ref 1.9–3.7)
Glucose, Bld: 78 mg/dL (ref 65–99)
Potassium: 4.3 mmol/L (ref 3.5–5.3)
Sodium: 134 mmol/L — ABNORMAL LOW (ref 135–146)
Total Bilirubin: 0.4 mg/dL (ref 0.2–1.2)
Total Protein: 7.2 g/dL (ref 6.1–8.1)

## 2020-02-26 LAB — PSA: PSA: 0.7 ng/mL (ref <=4.0)

## 2020-03-15 DIAGNOSIS — Z23 Encounter for immunization: Secondary | ICD-10-CM | POA: Diagnosis not present

## 2020-04-09 ENCOUNTER — Other Ambulatory Visit: Payer: Self-pay | Admitting: Family Medicine

## 2020-04-12 DIAGNOSIS — Z23 Encounter for immunization: Secondary | ICD-10-CM | POA: Diagnosis not present

## 2020-08-27 ENCOUNTER — Ambulatory Visit: Payer: Medicare Other | Admitting: Family Medicine

## 2020-08-27 NOTE — Progress Notes (Signed)
Established Patient Office Visit  Subjective:  Patient ID: Gerald Rogers, male    DOB: 23-Mar-1961  Age: 59 y.o. MRN: [id number]  CC:  Chief Complaint  Patient presents with  . Hypertension    HPI Gerald Rogers presents for Hypertension- Pt denies chest pain, SOB, dizziness, or heart palpitations.  Taking meds as directed w/o problems.  Denies medication side effects.    Due for 5 year recall colonoscopy.    Past Medical History:  Diagnosis Date  . Anxiety   . Arteriovenous fistula of spinal cord vessels   . Arthritis   . Headache   . Hyperlipidemia   . Hypertension     Past Surgical History:  Procedure Laterality Date  . angiogram spinal    . ANTERIOR CERVICAL DECOMP/DISCECTOMY FUSION N/A 03/23/2018   Procedure: ANTERIOR CERVICAL DECOMPRESSION/DISCECTOMY FUSION CERVICAL FOUR-FIVE, CERVICAL FIVE-SIX;  Surgeon: Melina Schools, MD;  Location: [facility];  Service: Orthopedics;  Laterality: N/A;  3 hrs  . EYE SURGERY     removal of foreign body  . VAGAL NERVE STIMULATOR REMOVAL  09/2015   Dr. Gloriann Loan     Family History  Problem Relation Age of Onset  . Heart disease Mother 33  . Hypertension Mother   . Cancer Father   . Stroke Maternal Aunt     Social History   Socioeconomic History  . Marital status: Married    Spouse name: Not on file  . Number of children: Not on file  . Years of education: Not on file  . Highest education level: Not on file  Occupational History  . Not on file  Tobacco Use  . Smoking status: Former Smoker    Packs/day: 1.00    Types: Cigarettes, E-cigarettes    Start date: 12/13/1970    Quit date: 02/11/2018    Years since quitting: 2.5  . Smokeless tobacco: Current User  Vaping Use  . Vaping Use: Every day  Substance and Sexual Activity  . Alcohol use: Yes    Alcohol/week: 0.0 standard drinks  . Drug use: No  . Sexual activity: Yes    Partners: Female  Other Topics Concern  . Not on file  Social History Narrative   No regular  exercise.     Social Determinants of Health   Financial Resource Strain:   . Difficulty of Paying Living Expenses: Not on file  Food Insecurity:   . Worried About Charity fundraiser in the Last Year: Not on file  . Ran Out of Food in the Last Year: Not on file  Transportation Needs:   . Lack of Transportation (Medical): Not on file  . Lack of Transportation (Non-Medical): Not on file  Physical Activity:   . Days of Exercise per Week: Not on file  . Minutes of Exercise per Session: Not on file  Stress:   . Feeling of Stress : Not on file  Social Connections:   . Frequency of Communication with Friends and Family: Not on file  . Frequency of Social Gatherings with Friends and Family: Not on file  . Attends Religious Services: Not on file  . Active Member of Clubs or Organizations: Not on file  . Attends Archivist Meetings: Not on file  . Marital Status: Not on file  Intimate Partner Violence:   . Fear of Current or Ex-Partner: Not on file  . Emotionally Abused: Not on file  . Physically Abused: Not on file  . Sexually Abused: Not on file  Outpatient Medications Prior to Visit  Medication Sig Dispense Refill  . aspirin 325 MG tablet Take 325 mg by mouth daily.    . pravastatin (PRAVACHOL) 20 MG tablet TAKE 1 TABLET BY MOUTH EVERY DAY 90 tablet 3  . losartan-hydrochlorothiazide (HYZAAR) 50-12.5 MG tablet TAKE 1 TABLET BY MOUTH EVERY DAY 90 tablet 1   No facility-administered medications prior to visit.    Allergies  Allergen Reactions  . Cymbalta [Duloxetine Hcl] Other (See Comments)    Generalized weakness and mental status changes  . Klonopin [Clonazepam] Other (See Comments)    Mental status changes    ROS Review of Systems    Objective:    Physical Exam Constitutional:      Appearance: He is well-developed.  HENT:     Head: Normocephalic and atraumatic.  Cardiovascular:     Rate and Rhythm: Normal rate and regular rhythm.     Heart sounds:  Normal heart sounds.  Pulmonary:     Effort: Pulmonary effort is normal.     Breath sounds: Normal breath sounds.  Skin:    General: Skin is warm and dry.  Neurological:     Mental Status: He is alert and oriented to person, place, and time.  Psychiatric:        Behavior: Behavior normal.     BP 137/79   Pulse 77   Ht 5\' 4"  (1.626 m)   Wt 149 lb (67.6 kg)   SpO2 96%   BMI 25.58 kg/m  Wt Readings from Last 3 Encounters:  08/28/20 149 lb (67.6 kg)  02/25/20 145 lb (65.8 kg)  08/27/19 141 lb 1.9 oz (64 kg)     There are no preventive care reminders to display for this patient.  There are no preventive care reminders to display for this patient.  No results found for: TSH Lab Results  Component Value Date   WBC 10.7 (H) 03/20/2018   HGB 15.8 03/20/2018   HCT 45.3 03/20/2018   MCV 93.6 03/20/2018   PLT 270 03/20/2018   Lab Results  Component Value Date   NA 134 (L) 02/25/2020   K 4.3 02/25/2020   CO2 30 02/25/2020   GLUCOSE 78 02/25/2020   BUN 13 02/25/2020   CREATININE 0.82 02/25/2020   BILITOT 0.4 02/25/2020   ALKPHOS 87 02/15/2017   AST 18 02/25/2020   ALT 24 02/25/2020   PROT 7.2 02/25/2020   ALBUMIN 4.2 02/15/2017   CALCIUM 10.2 02/25/2020   ANIONGAP 13 03/20/2018   Lab Results  Component Value Date   CHOL 141 02/25/2020   Lab Results  Component Value Date   HDL 45 02/25/2020   Lab Results  Component Value Date   LDLCALC 71 02/25/2020   Lab Results  Component Value Date   TRIG 178 (H) 02/25/2020   Lab Results  Component Value Date   CHOLHDL 3.1 02/25/2020   No results found for: HGBA1C    Assessment & Plan:   Problem List Items Addressed This Visit      Cardiovascular and Mediastinum   Benign essential hypertension - Primary    BP not quite at goal. Will inc the losartan component of med.  F/U in 6 months.  Due for BMP.        Relevant Medications   losartan-hydrochlorothiazide (HYZAAR) 100-12.5 MG tablet   Other Relevant  Orders   BASIC METABOLIC PANEL WITH GFR     Other   Hyperlipidemia    Tolerating the statin well.  Relevant Medications   losartan-hydrochlorothiazide (HYZAAR) 100-12.5 MG tablet    Other Visit Diagnoses    Need for immunization against influenza       Relevant Orders   Flu Vaccine QUAD 36+ mos IM (Completed)      Meds ordered this encounter  Medications  . losartan-hydrochlorothiazide (HYZAAR) 100-12.5 MG tablet    Sig: Take 1 tablet by mouth daily.    Dispense:  90 tablet    Refill:  1    Follow-up: Return in about 6 months (around 02/25/2021) for Hypertension.    Beatrice Lecher, MD

## 2020-08-28 ENCOUNTER — Encounter: Payer: Self-pay | Admitting: Family Medicine

## 2020-08-28 ENCOUNTER — Ambulatory Visit (INDEPENDENT_AMBULATORY_CARE_PROVIDER_SITE_OTHER): Payer: Medicare Other | Admitting: Family Medicine

## 2020-08-28 ENCOUNTER — Ambulatory Visit: Payer: Medicare Other | Admitting: Family Medicine

## 2020-08-28 VITALS — BP 137/79 | HR 77 | Ht 64.0 in | Wt 149.0 lb

## 2020-08-28 DIAGNOSIS — E7800 Pure hypercholesterolemia, unspecified: Secondary | ICD-10-CM

## 2020-08-28 DIAGNOSIS — Z23 Encounter for immunization: Secondary | ICD-10-CM | POA: Diagnosis not present

## 2020-08-28 DIAGNOSIS — I10 Essential (primary) hypertension: Secondary | ICD-10-CM

## 2020-08-28 MED ORDER — LOSARTAN POTASSIUM-HCTZ 100-12.5 MG PO TABS: 1.0000 | ORAL_TABLET | Freq: Every day | ORAL | 1 refills | Status: DC

## 2020-08-28 NOTE — Assessment & Plan Note (Signed)
BP not quite at goal. Will inc the losartan component of med.  F/U in 6 months.  Due for BMP.

## 2020-08-28 NOTE — Assessment & Plan Note (Signed)
Tolerating the statin well.

## 2020-08-29 ENCOUNTER — Other Ambulatory Visit: Payer: Self-pay | Admitting: *Deleted

## 2020-08-29 LAB — BASIC METABOLIC PANEL WITH GFR
BUN: 13 mg/dL (ref 7–25)
CO2: 31 mmol/L (ref 20–32)
Calcium: 10.7 mg/dL — ABNORMAL HIGH (ref 8.6–10.3)
Chloride: 94 mmol/L — ABNORMAL LOW (ref 98–110)
Creat: 0.81 mg/dL (ref 0.70–1.25)
GFR, Est African American: 112 mL/min/{1.73_m2} (ref >=60)
GFR, Est Non African American: 97 mL/min/{1.73_m2} (ref >=60)
Glucose, Bld: 85 mg/dL (ref 65–99)
Potassium: 4.4 mmol/L (ref 3.5–5.3)
Sodium: 133 mmol/L — ABNORMAL LOW (ref 135–146)

## 2020-09-26 LAB — COMPREHENSIVE METABOLIC PANEL
AG Ratio: 1.6 (calc) (ref 1.0–2.5)
ALT: 25 U/L (ref 9–46)
AST: 19 U/L (ref 10–35)
Albumin: 4.2 g/dL (ref 3.6–5.1)
Alkaline phosphatase (APISO): 58 U/L (ref 35–144)
BUN: 13 mg/dL (ref 7–25)
CO2: 29 mmol/L (ref 20–32)
Calcium: 9.6 mg/dL (ref 8.6–10.3)
Chloride: 95 mmol/L — ABNORMAL LOW (ref 98–110)
Creat: 0.82 mg/dL (ref 0.70–1.25)
Globulin: 2.7 g/dL (calc) (ref 1.9–3.7)
Glucose, Bld: 83 mg/dL (ref 65–139)
Potassium: 4.2 mmol/L (ref 3.5–5.3)
Sodium: 128 mmol/L — ABNORMAL LOW (ref 135–146)
Total Bilirubin: 0.5 mg/dL (ref 0.2–1.2)
Total Protein: 6.9 g/dL (ref 6.1–8.1)

## 2020-09-30 ENCOUNTER — Other Ambulatory Visit: Payer: Self-pay

## 2020-09-30 DIAGNOSIS — E871 Hypo-osmolality and hyponatremia: Secondary | ICD-10-CM

## 2020-09-30 NOTE — Progress Notes (Signed)
Ordered labs per result notes.  °

## 2020-10-05 ENCOUNTER — Other Ambulatory Visit: Payer: Self-pay | Admitting: Family Medicine

## 2020-10-05 DIAGNOSIS — E7800 Pure hypercholesterolemia, unspecified: Secondary | ICD-10-CM

## 2020-10-09 DIAGNOSIS — E871 Hypo-osmolality and hyponatremia: Secondary | ICD-10-CM | POA: Diagnosis not present

## 2020-10-10 LAB — COMPLETE METABOLIC PANEL WITH GFR
AG Ratio: 1.4 (calc) (ref 1.0–2.5)
ALT: 24 U/L (ref 9–46)
AST: 19 U/L (ref 10–35)
Albumin: 4.2 g/dL (ref 3.6–5.1)
Alkaline phosphatase (APISO): 64 U/L (ref 35–144)
BUN: 14 mg/dL (ref 7–25)
CO2: 29 mmol/L (ref 20–32)
Calcium: 10.2 mg/dL (ref 8.6–10.3)
Chloride: 94 mmol/L — ABNORMAL LOW (ref 98–110)
Creat: 0.73 mg/dL (ref 0.70–1.25)
GFR, Est African American: 116 mL/min/{1.73_m2} (ref >=60)
GFR, Est Non African American: 100 mL/min/{1.73_m2} (ref >=60)
Globulin: 3 g/dL (calc) (ref 1.9–3.7)
Glucose, Bld: 82 mg/dL (ref 65–139)
Potassium: 4.7 mmol/L (ref 3.5–5.3)
Sodium: 132 mmol/L — ABNORMAL LOW (ref 135–146)
Total Bilirubin: 0.3 mg/dL (ref 0.2–1.2)
Total Protein: 7.2 g/dL (ref 6.1–8.1)

## 2020-11-27 DIAGNOSIS — Z23 Encounter for immunization: Secondary | ICD-10-CM | POA: Diagnosis not present

## 2021-02-23 ENCOUNTER — Other Ambulatory Visit: Payer: Self-pay | Admitting: Family Medicine

## 2021-02-23 ENCOUNTER — Other Ambulatory Visit: Payer: Self-pay | Admitting: *Deleted

## 2021-02-25 ENCOUNTER — Other Ambulatory Visit: Payer: Self-pay

## 2021-02-25 ENCOUNTER — Encounter: Payer: Self-pay | Admitting: Family Medicine

## 2021-02-25 ENCOUNTER — Ambulatory Visit (INDEPENDENT_AMBULATORY_CARE_PROVIDER_SITE_OTHER): Payer: Medicare Other | Admitting: Family Medicine

## 2021-02-25 VITALS — BP 128/85 | HR 87 | Temp 98.9°F | Resp 20 | Ht 64.0 in | Wt 147.0 lb

## 2021-02-25 DIAGNOSIS — E871 Hypo-osmolality and hyponatremia: Secondary | ICD-10-CM

## 2021-02-25 DIAGNOSIS — Z125 Encounter for screening for malignant neoplasm of prostate: Secondary | ICD-10-CM

## 2021-02-25 DIAGNOSIS — I10 Essential (primary) hypertension: Secondary | ICD-10-CM

## 2021-02-25 DIAGNOSIS — E7800 Pure hypercholesterolemia, unspecified: Secondary | ICD-10-CM

## 2021-02-25 MED ORDER — ASPIRIN EC 81 MG PO TBEC: 81.0000 mg | DELAYED_RELEASE_TABLET | Freq: Every day | ORAL | 11 refills | Status: DC

## 2021-02-25 NOTE — Assessment & Plan Note (Signed)
Due to recheck lipids.  Currently on pravastatin 20.

## 2021-02-25 NOTE — Progress Notes (Signed)
Established Patient Office Visit  Subjective:  Patient ID: Gerald Rogers, male    DOB: 09-07-1961  Age: 60 y.o. MRN: [id number]  CC:  Chief Complaint  Patient presents with  . Hypertension    HPI Gerald Rogers presents for 6 mo f/u  Hypertension- Pt denies chest pain, SOB, dizziness, or heart palpitations.  Taking meds as directed w/o problems.  Denies medication side effects.   Hyperlipidemia - tolerating stating well with no myalgias or significant side effects.  Lab Results  Component Value Date   CHOL 141 02/25/2020   HDL 45 02/25/2020   LDLCALC 71 02/25/2020   TRIG 178 (H) 02/25/2020   CHOLHDL 3.1 02/25/2020       Past Medical History:  Diagnosis Date  . Anxiety   . Arteriovenous fistula of spinal cord vessels   . Arthritis   . Headache   . Hyperlipidemia   . Hypertension     Past Surgical History:  Procedure Laterality Date  . angiogram spinal    . ANTERIOR CERVICAL DECOMP/DISCECTOMY FUSION N/A 03/23/2018   Procedure: ANTERIOR CERVICAL DECOMPRESSION/DISCECTOMY FUSION CERVICAL FOUR-FIVE, CERVICAL FIVE-SIX;  Surgeon: Melina Schools, MD;  Location: [facility];  Service: Orthopedics;  Laterality: N/A;  3 hrs  . EYE SURGERY     removal of foreign body  . VAGAL NERVE STIMULATOR REMOVAL  09/2015   Dr. Gloriann Loan     Family History  Problem Relation Age of Onset  . Heart disease Mother 44  . Hypertension Mother   . Cancer Father   . Stroke Maternal Aunt     Social History   Socioeconomic History  . Marital status: Married    Spouse name: Not on file  . Number of children: Not on file  . Years of education: Not on file  . Highest education level: Not on file  Occupational History  . Not on file  Tobacco Use  . Smoking status: Former Smoker    Packs/day: 1.00    Types: Cigarettes, E-cigarettes    Start date: 12/13/1970    Quit date: 02/11/2018    Years since quitting: 3.0  . Smokeless tobacco: Current User  Vaping Use  . Vaping Use: Every day  Substance  and Sexual Activity  . Alcohol use: Yes    Alcohol/week: 0.0 standard drinks  . Drug use: No  . Sexual activity: Yes    Partners: Female  Other Topics Concern  . Not on file  Social History Narrative   No regular exercise.     Social Determinants of Health   Financial Resource Strain: Not on file  Food Insecurity: Not on file  Transportation Needs: Not on file  Physical Activity: Not on file  Stress: Not on file  Social Connections: Not on file  Intimate Partner Violence: Not on file    Outpatient Medications Prior to Visit  Medication Sig Dispense Refill  . losartan-hydrochlorothiazide (HYZAAR) 50-12.5 MG tablet TAKE 1 TABLET BY MOUTH EVERY DAY 90 tablet 1  . pravastatin (PRAVACHOL) 20 MG tablet TAKE 1 TABLET BY MOUTH EVERY DAY 90 tablet 1  . aspirin 325 MG tablet Take 325 mg by mouth daily.     No facility-administered medications prior to visit.    Allergies  Allergen Reactions  . Cymbalta [Duloxetine Hcl] Other (See Comments)    Generalized weakness and mental status changes  . Klonopin [Clonazepam] Other (See Comments)    Mental status changes    ROS Review of Systems    Objective:  Physical Exam Constitutional:      Appearance: He is well-developed.  HENT:     Head: Normocephalic and atraumatic.  Cardiovascular:     Rate and Rhythm: Normal rate and regular rhythm.     Heart sounds: Normal heart sounds.  Pulmonary:     Effort: Pulmonary effort is normal.     Breath sounds: Normal breath sounds.  Skin:    General: Skin is warm and dry.  Neurological:     Mental Status: He is alert and oriented to person, place, and time.  Psychiatric:        Behavior: Behavior normal.     BP 128/85 (BP Location: Left Arm, Patient Position: Sitting, Cuff Size: Normal)   Pulse 87   Temp 98.9 F (37.2 C) (Oral)   Resp 20   Ht 5\' 4"  (1.626 m)   Wt 147 lb (66.7 kg)   SpO2 99%   BMI 25.23 kg/m  Wt Readings from Last 3 Encounters:  02/25/21 147 lb (66.7 kg)   08/28/20 149 lb (67.6 kg)  02/25/20 145 lb (65.8 kg)     Health Maintenance Due  Topic Date Due  . COVID-19 Vaccine (3 - Booster for Moderna series) 10/13/2020    There are no preventive care reminders to display for this patient.  No results found for: TSH Lab Results  Component Value Date   WBC 12.2 (H) 02/25/2021   HGB 17.7 (H) 02/25/2021   HCT 51.9 (H) 02/25/2021   MCV 90.3 02/25/2021   PLT 249 02/25/2021   Lab Results  Component Value Date   NA 132 (L) 10/09/2020   K 4.7 10/09/2020   CO2 29 10/09/2020   GLUCOSE 82 10/09/2020   BUN 14 10/09/2020   CREATININE 0.73 10/09/2020   BILITOT 0.3 10/09/2020   ALKPHOS 87 02/15/2017   AST 19 10/09/2020   ALT 24 10/09/2020   PROT 7.2 10/09/2020   ALBUMIN 4.2 02/15/2017   CALCIUM 10.2 10/09/2020   ANIONGAP 13 03/20/2018   Lab Results  Component Value Date   CHOL 141 02/25/2020   Lab Results  Component Value Date   HDL 45 02/25/2020   Lab Results  Component Value Date   LDLCALC 71 02/25/2020   Lab Results  Component Value Date   TRIG 178 (H) 02/25/2020   Lab Results  Component Value Date   CHOLHDL 3.1 02/25/2020   No results found for: HGBA1C    Assessment & Plan:   Problem List Items Addressed This Visit      Cardiovascular and Mediastinum   Benign essential hypertension    Well controlled. Continue current regimen. Follow up in  6 mo       Relevant Medications   aspirin EC 81 MG tablet   Other Relevant Orders   COMPLETE METABOLIC PANEL WITH GFR   Lipid panel   CBC (Completed)   PSA     Other   Hyperlipidemia    Due to recheck lipids.  Currently on pravastatin 20.      Relevant Medications   aspirin EC 81 MG tablet   Other Relevant Orders   COMPLETE METABOLIC PANEL WITH GFR   Lipid panel   CBC (Completed)   PSA    Other Visit Diagnoses    Low sodium levels    -  Primary   Relevant Orders   COMPLETE METABOLIC PANEL WITH GFR   Lipid panel   CBC (Completed)   PSA   Serum calcium  elevated  Relevant Orders   COMPLETE METABOLIC PANEL WITH GFR   Lipid panel   CBC (Completed)   PSA   Screening for prostate cancer       Relevant Orders   PSA     We also discussed decreasing his aspirin I am not sure what indication he was initially started on the 325.  He says at some point it was increased years ago.  I had it on his list for at least the last 9 years.  So we did discuss going back down to 81 mg.  Hyponatremia-he has chronic hyponatremia but I do want to keep an eye on it.  Plan to recheck levels today.  Due for screening PSA as well.  Meds ordered this encounter  Medications  . aspirin EC 81 MG tablet    Sig: Take 1 tablet (81 mg total) by mouth daily. Swallow whole.    Dispense:  30 tablet    Refill:  11    Follow-up: Return in about 6 months (around 08/28/2021) for Hypertension.    Beatrice Lecher, MD

## 2021-02-25 NOTE — Assessment & Plan Note (Signed)
Well controlled. Continue current regimen. Follow up in  6 mo  

## 2021-02-26 LAB — COMPLETE METABOLIC PANEL WITH GFR
AG Ratio: 1.4 (calc) (ref 1.0–2.5)
ALT: 28 U/L (ref 9–46)
AST: 18 U/L (ref 10–35)
Albumin: 4.2 g/dL (ref 3.6–5.1)
Alkaline phosphatase (APISO): 62 U/L (ref 35–144)
BUN: 15 mg/dL (ref 7–25)
CO2: 30 mmol/L (ref 20–32)
Calcium: 10.2 mg/dL (ref 8.6–10.3)
Chloride: 96 mmol/L — ABNORMAL LOW (ref 98–110)
Creat: 0.82 mg/dL (ref 0.70–1.25)
GFR, Est African American: 111 mL/min/{1.73_m2} (ref >=60)
GFR, Est Non African American: 95 mL/min/{1.73_m2} (ref >=60)
Globulin: 2.9 g/dL (calc) (ref 1.9–3.7)
Glucose, Bld: 81 mg/dL (ref 65–139)
Potassium: 4.3 mmol/L (ref 3.5–5.3)
Sodium: 134 mmol/L — ABNORMAL LOW (ref 135–146)
Total Bilirubin: 0.3 mg/dL (ref 0.2–1.2)
Total Protein: 7.1 g/dL (ref 6.1–8.1)

## 2021-02-26 LAB — LIPID PANEL
Cholesterol: 148 mg/dL (ref <200)
HDL: 47 mg/dL (ref >=40)
LDL Cholesterol (Calc): 72 mg/dL (calc)
Non-HDL Cholesterol (Calc): 101 mg/dL (calc) (ref <130)
Total CHOL/HDL Ratio: 3.1 (calc) (ref <5.0)
Triglycerides: 192 mg/dL — ABNORMAL HIGH (ref <150)

## 2021-02-26 LAB — CBC
HCT: 51.9 % — ABNORMAL HIGH (ref 38.5–50.0)
Hemoglobin: 17.7 g/dL — ABNORMAL HIGH (ref 13.2–17.1)
MCH: 30.8 pg (ref 27.0–33.0)
MCHC: 34.1 g/dL (ref 32.0–36.0)
MCV: 90.3 fL (ref 80.0–100.0)
MPV: 12.1 fL (ref 7.5–12.5)
Platelets: 249 10*3/uL (ref 140–400)
RBC: 5.75 10*6/uL (ref 4.20–5.80)
RDW: 13.7 % (ref 11.0–15.0)
WBC: 12.2 10*3/uL — ABNORMAL HIGH (ref 3.8–10.8)

## 2021-02-26 LAB — PSA: PSA: 0.82 ng/mL (ref <=4.0)

## 2021-02-28 ENCOUNTER — Other Ambulatory Visit: Payer: Self-pay | Admitting: Family Medicine

## 2021-03-03 ENCOUNTER — Other Ambulatory Visit: Payer: Self-pay

## 2021-03-03 DIAGNOSIS — E7800 Pure hypercholesterolemia, unspecified: Secondary | ICD-10-CM

## 2021-03-03 MED ORDER — PRAVASTATIN SODIUM 20 MG PO TABS: 20.0000 mg | ORAL_TABLET | Freq: Every day | ORAL | 1 refills | Status: DC

## 2021-03-03 MED ORDER — LOSARTAN POTASSIUM-HCTZ 50-12.5 MG PO TABS
1.0000 | ORAL_TABLET | Freq: Every day | ORAL | 1 refills | Status: DC
Start: 2021-03-03 — End: 2021-08-31

## 2021-08-31 ENCOUNTER — Other Ambulatory Visit: Payer: Self-pay

## 2021-08-31 ENCOUNTER — Encounter: Payer: Self-pay | Admitting: Family Medicine

## 2021-08-31 ENCOUNTER — Ambulatory Visit (INDEPENDENT_AMBULATORY_CARE_PROVIDER_SITE_OTHER): Payer: Medicare Other | Admitting: Family Medicine

## 2021-08-31 VITALS — BP 150/82 | HR 72 | Ht 64.0 in | Wt 145.0 lb

## 2021-08-31 DIAGNOSIS — I10 Essential (primary) hypertension: Secondary | ICD-10-CM

## 2021-08-31 DIAGNOSIS — E7800 Pure hypercholesterolemia, unspecified: Secondary | ICD-10-CM | POA: Diagnosis not present

## 2021-08-31 DIAGNOSIS — Z23 Encounter for immunization: Secondary | ICD-10-CM | POA: Diagnosis not present

## 2021-08-31 DIAGNOSIS — Z1211 Encounter for screening for malignant neoplasm of colon: Secondary | ICD-10-CM

## 2021-08-31 MED ORDER — LOSARTAN POTASSIUM-HCTZ 100-12.5 MG PO TABS: 1.0000 | ORAL_TABLET | Freq: Every day | ORAL | 1 refills | Status: DC

## 2021-08-31 MED ORDER — PRAVASTATIN SODIUM 20 MG PO TABS: 20.0000 mg | ORAL_TABLET | Freq: Every day | ORAL | 3 refills | Status: DC

## 2021-08-31 MED ORDER — SHINGRIX 50 MCG/0.5ML IM SUSR: 0.5000 mL | Freq: Once | INTRAMUSCULAR | 0 refills | Status: AC

## 2021-08-31 MED ORDER — SHINGRIX 50 MCG/0.5ML IM SUSR: 0.5000 mL | Freq: Once | INTRAMUSCULAR | 0 refills | Status: DC

## 2021-08-31 NOTE — Patient Instructions (Signed)
Follow up in 2 weeks for BP nurse visit

## 2021-08-31 NOTE — Assessment & Plan Note (Signed)
BP uncontrolled.  Will inc losartan component of his medication.  F/U in 2 weeks for nurse bp visit.

## 2021-08-31 NOTE — Progress Notes (Signed)
Established Patient Office Visit  Subjective:  Patient ID: Gerald Rogers, male    DOB: 05/07/1961  Age: 60 y.o. MRN: [id number]  CC:  Chief Complaint  Patient presents with   Hypertension    HPI Gerald Rogers presents for   Hypertension- Pt denies chest pain, SOB, dizziness, or heart palpitations.  Taking meds as directed w/o problems.  Denies medication side effects.     He did decrease his aspirin to 81 mg since I last saw him and is doing well.  He wanted discussed coming completely off.  I was never able to find a specific diagnosis for why he was actually started on aspirin except for maybe primary prevention and the guidelines have changed on that.  Hyperlipidemia - tolerating stating well with no myalgias or significant side effects.  Lab Results  Component Value Date   CHOL 148 02/25/2021   HDL 47 02/25/2021   LDLCALC 72 02/25/2021   TRIG 192 (H) 02/25/2021   CHOLHDL 3.1 02/25/2021      Past Medical History:  Diagnosis Date   Anxiety    Arteriovenous fistula of spinal cord vessels    Arthritis    Headache    Hyperlipidemia    Hypertension     Past Surgical History:  Procedure Laterality Date   angiogram spinal     ANTERIOR CERVICAL DECOMP/DISCECTOMY FUSION N/A 03/23/2018   Procedure: ANTERIOR CERVICAL DECOMPRESSION/DISCECTOMY FUSION CERVICAL FOUR-FIVE, CERVICAL FIVE-SIX;  Surgeon: Melina Schools, MD;  Location: [facility];  Service: Orthopedics;  Laterality: N/A;  3 hrs   EYE SURGERY     removal of foreign body   VAGAL NERVE STIMULATOR REMOVAL  09/2015   Dr. Gloriann Loan     Family History  Problem Relation Age of Onset   Heart disease Mother 42   Hypertension Mother    Cancer Father    Stroke Maternal Aunt     Social History   Socioeconomic History   Marital status: Married    Spouse name: Not on file   Number of children: Not on file   Years of education: Not on file   Highest education level: Not on file  Occupational History   Not on file   Tobacco Use   Smoking status: Former    Packs/day: 1.00    Types: Cigarettes, E-cigarettes    Start date: 12/13/1970    Quit date: 02/11/2018    Years since quitting: 3.5   Smokeless tobacco: Current  Vaping Use   Vaping Use: Every day  Substance and Sexual Activity   Alcohol use: Yes    Alcohol/week: 0.0 standard drinks   Drug use: No   Sexual activity: Yes    Partners: Female  Other Topics Concern   Not on file  Social History Narrative   No regular exercise.     Social Determinants of Health   Financial Resource Strain: Not on file  Food Insecurity: Not on file  Transportation Needs: Not on file  Physical Activity: Not on file  Stress: Not on file  Social Connections: Not on file  Intimate Partner Violence: Not on file    Outpatient Medications Prior to Visit  Medication Sig Dispense Refill   aspirin EC 81 MG tablet Take 1 tablet (81 mg total) by mouth daily. Swallow whole. 30 tablet 11   losartan-hydrochlorothiazide (HYZAAR) 50-12.5 MG tablet Take 1 tablet by mouth daily. 90 tablet 1   pravastatin (PRAVACHOL) 20 MG tablet Take 1 tablet (20 mg total) by mouth daily.  90 tablet 1   No facility-administered medications prior to visit.    Allergies  Allergen Reactions   Cymbalta [Duloxetine Hcl] Other (See Comments)    Generalized weakness and mental status changes   Klonopin [Clonazepam] Other (See Comments)    Mental status changes    ROS Review of Systems    Objective:    Physical Exam Constitutional:      Appearance: Normal appearance. He is well-developed.  HENT:     Head: Normocephalic and atraumatic.  Cardiovascular:     Rate and Rhythm: Normal rate and regular rhythm.     Heart sounds: Normal heart sounds.  Pulmonary:     Effort: Pulmonary effort is normal.     Breath sounds: Normal breath sounds.  Skin:    General: Skin is warm and dry.  Neurological:     Mental Status: He is alert and oriented to person, place, and time. Mental status is at  baseline.  Psychiatric:        Behavior: Behavior normal.    BP (!) 150/82   Pulse 72   Ht 5\' 4"  (1.626 m)   Wt 145 lb (65.8 kg)   SpO2 98%   BMI 24.89 kg/m  Wt Readings from Last 3 Encounters:  08/31/21 145 lb (65.8 kg)  02/25/21 147 lb (66.7 kg)  08/28/20 149 lb (67.6 kg)     Health Maintenance Due  Topic Date Due   Zoster Vaccines- Shingrix (1 of 2) Never done   COLONOSCOPY (Pts 45-21yrs Insurance coverage will need to be confirmed)  07/03/2020    There are no preventive care reminders to display for this patient.  No results found for: TSH Lab Results  Component Value Date   WBC 12.2 (H) 02/25/2021   HGB 17.7 (H) 02/25/2021   HCT 51.9 (H) 02/25/2021   MCV 90.3 02/25/2021   PLT 249 02/25/2021   Lab Results  Component Value Date   NA 134 (L) 02/25/2021   K 4.3 02/25/2021   CO2 30 02/25/2021   GLUCOSE 81 02/25/2021   BUN 15 02/25/2021   CREATININE 0.82 02/25/2021   BILITOT 0.3 02/25/2021   ALKPHOS 87 02/15/2017   AST 18 02/25/2021   ALT 28 02/25/2021   PROT 7.1 02/25/2021   ALBUMIN 4.2 02/15/2017   CALCIUM 10.2 02/25/2021   ANIONGAP 13 03/20/2018   Lab Results  Component Value Date   CHOL 148 02/25/2021   Lab Results  Component Value Date   HDL 47 02/25/2021   Lab Results  Component Value Date   LDLCALC 72 02/25/2021   Lab Results  Component Value Date   TRIG 192 (H) 02/25/2021   Lab Results  Component Value Date   CHOLHDL 3.1 02/25/2021   No results found for: HGBA1C    Assessment & Plan:   Problem List Items Addressed This Visit       Cardiovascular and Mediastinum   Benign essential hypertension - Primary    BP uncontrolled.  Will inc losartan component of his medication.  F/U in 2 weeks for nurse bp visit.        Relevant Medications   pravastatin (PRAVACHOL) 20 MG tablet   losartan-hydrochlorothiazide (HYZAAR) 100-12.5 MG tablet   Other Relevant Orders   BASIC METABOLIC PANEL WITH GFR     Other   Hyperlipidemia    Relevant Medications   pravastatin (PRAVACHOL) 20 MG tablet   losartan-hydrochlorothiazide (HYZAAR) 100-12.5 MG tablet   Other Visit Diagnoses     Screening for  colon cancer       Relevant Orders   Ambulatory referral to Gastroenterology   Need for Zostavax administration       Relevant Medications   Zoster Vaccine Adjuvanted [facility]) injection   Need for immunization against influenza       Relevant Orders   Flu Vaccine QUAD 101mo+IM (Fluarix, Fluzone & Alfiuria Quad PF) (Completed)       OK to stop his ASA.   Meds ordered this encounter  Medications   DISCONTD: Zoster Vaccine Adjuvanted [facility]) injection    Sig: Inject 0.5 mLs into the muscle once for 1 dose. First dose now; second dose in 2-6 months after first.    Dispense:  0.5 mL    Refill:  0   pravastatin (PRAVACHOL) 20 MG tablet    Sig: Take 1 tablet (20 mg total) by mouth daily.    Dispense:  90 tablet    Refill:  3   Zoster Vaccine Adjuvanted [facility]) injection    Sig: Inject 0.5 mLs into the muscle once for 1 dose. First dose now; second dose in 2-6 months after first.    Dispense:  0.5 mL    Refill:  0   losartan-hydrochlorothiazide (HYZAAR) 100-12.5 MG tablet    Sig: Take 1 tablet by mouth daily.    Dispense:  90 tablet    Refill:  1    Follow-up: Return in about 6 months (around 02/28/2022) for Hypertension.    Beatrice Lecher, MD

## 2021-09-01 LAB — BASIC METABOLIC PANEL WITH GFR
BUN: 11 mg/dL (ref 7–25)
CO2: 32 mmol/L (ref 20–32)
Calcium: 10.6 mg/dL — ABNORMAL HIGH (ref 8.6–10.3)
Chloride: 93 mmol/L — ABNORMAL LOW (ref 98–110)
Creat: 0.87 mg/dL (ref 0.70–1.35)
Glucose, Bld: 84 mg/dL (ref 65–99)
Potassium: 4.8 mmol/L (ref 3.5–5.3)
Sodium: 133 mmol/L — ABNORMAL LOW (ref 135–146)
eGFR: 98 mL/min/{1.73_m2} (ref >=60)

## 2021-09-01 NOTE — Progress Notes (Signed)
Your lab work is within acceptable range and there are no concerning findings.   ?

## 2021-09-14 ENCOUNTER — Ambulatory Visit (INDEPENDENT_AMBULATORY_CARE_PROVIDER_SITE_OTHER): Payer: Medicare Other | Admitting: Family Medicine

## 2021-09-14 ENCOUNTER — Other Ambulatory Visit: Payer: Self-pay

## 2021-09-14 VITALS — BP 133/88 | HR 93

## 2021-09-14 DIAGNOSIS — I10 Essential (primary) hypertension: Secondary | ICD-10-CM

## 2021-09-14 NOTE — Progress Notes (Signed)
Established Patient Office Visit  Subjective:  Patient ID: Gerald Rogers, male    DOB: 06-May-1961  Age: 60 y.o. MRN: [id number]  CC:  Chief Complaint  Patient presents with   Hypertension    HPI Gerald Rogers presents for blood pressure check. Denies chest pain, shortness of breath or dizziness.   Past Medical History:  Diagnosis Date   Anxiety    Arteriovenous fistula of spinal cord vessels    Arthritis    Headache    Hyperlipidemia    Hypertension     Past Surgical History:  Procedure Laterality Date   angiogram spinal     ANTERIOR CERVICAL DECOMP/DISCECTOMY FUSION N/A 03/23/2018   Procedure: ANTERIOR CERVICAL DECOMPRESSION/DISCECTOMY FUSION CERVICAL FOUR-FIVE, CERVICAL FIVE-SIX;  Surgeon: Melina Schools, MD;  Location: [place];  Service: Orthopedics;  Laterality: N/A;  3 hrs   EYE SURGERY     removal of foreign body   VAGAL NERVE STIMULATOR REMOVAL  09/2015   Dr. Gloriann Loan     Family History  Problem Relation Age of Onset   Heart disease Mother 17   Hypertension Mother    Cancer Father    Stroke Maternal Aunt     Social History   Socioeconomic History   Marital status: Married    Spouse name: Not on file   Number of children: Not on file   Years of education: Not on file   Highest education level: Not on file  Occupational History   Not on file  Tobacco Use   Smoking status: Former    Packs/day: 1.00    Types: Cigarettes, E-cigarettes    Start date: 12/13/1970    Quit date: 02/11/2018    Years since quitting: 3.5   Smokeless tobacco: Current  Vaping Use   Vaping Use: Every day  Substance and Sexual Activity   Alcohol use: Yes    Alcohol/week: 0.0 standard drinks   Drug use: No   Sexual activity: Yes    Partners: Female  Other Topics Concern   Not on file  Social History Narrative   No regular exercise.     Social Determinants of Health   Financial Resource Strain: Not on file  Food Insecurity: Not on file  Transportation Needs: Not on file   Physical Activity: Not on file  Stress: Not on file  Social Connections: Not on file  Intimate Partner Violence: Not on file    Outpatient Medications Prior to Visit  Medication Sig Dispense Refill   aspirin EC 81 MG tablet Take 81 mg by mouth daily. Swallow whole.     losartan-hydrochlorothiazide (HYZAAR) 100-12.5 MG tablet Take 1 tablet by mouth daily. 90 tablet 1   pravastatin (PRAVACHOL) 20 MG tablet Take 1 tablet (20 mg total) by mouth daily. 90 tablet 3   No facility-administered medications prior to visit.    Allergies  Allergen Reactions   Cymbalta [Duloxetine Hcl] Other (See Comments)    Generalized weakness and mental status changes   Klonopin [Clonazepam] Other (See Comments)    Mental status changes    ROS Review of Systems    Objective:    Physical Exam  BP 133/88   Pulse 93   SpO2 97%  Wt Readings from Last 3 Encounters:  08/31/21 145 lb (65.8 kg)  02/25/21 147 lb (66.7 kg)  08/28/20 149 lb (67.6 kg)     Health Maintenance Due  Topic Date Due   Zoster Vaccines- Shingrix (1 of 2) Never done   COLONOSCOPY (  Pts 45-31yr Insurance coverage will need to be confirmed)  07/03/2020    There are no preventive care reminders to display for this patient.  No results found for: TSH Lab Results  Component Value Date   WBC 12.2 (H) 02/25/2021   HGB 17.7 (H) 02/25/2021   HCT 51.9 (H) 02/25/2021   MCV 90.3 02/25/2021   PLT 249 02/25/2021   Lab Results  Component Value Date   NA 133 (L) 08/31/2021   K 4.8 08/31/2021   CO2 32 08/31/2021   GLUCOSE 84 08/31/2021   BUN 11 08/31/2021   CREATININE 0.87 08/31/2021   BILITOT 0.3 02/25/2021   ALKPHOS 87 02/15/2017   AST 18 02/25/2021   ALT 28 02/25/2021   PROT 7.1 02/25/2021   ALBUMIN 4.2 02/15/2017   CALCIUM 10.6 (H) 08/31/2021   ANIONGAP 13 03/20/2018   EGFR 98 08/31/2021   Lab Results  Component Value Date   CHOL 148 02/25/2021   Lab Results  Component Value Date   HDL 47 02/25/2021   Lab  Results  Component Value Date   LDLCALC 72 02/25/2021   Lab Results  Component Value Date   TRIG 192 (H) 02/25/2021   Lab Results  Component Value Date   CHOLHDL 3.1 02/25/2021   No results found for: HGBA1C    Assessment & Plan:  HTN - Blood pressure in second check was within normal limits. Patient advised to continue current medications. Also to follow up in 3 months with Dr MMadilyn Fireman    Problem List Items Addressed This Visit     Benign essential hypertension - Primary   Relevant Medications   aspirin EC 81 MG tablet    No orders of the defined types were placed in this encounter.   Follow-up: Return in about 3 months (around 12/15/2021) for HTN with Dr MMadilyn Fireman .Durene Romans AMonico Blitz [place]

## 2021-09-14 NOTE — Progress Notes (Signed)
Agree with documentation as above.   Allee Busk, MD  

## 2021-09-22 ENCOUNTER — Telehealth: Payer: Self-pay | Admitting: *Deleted

## 2021-09-22 NOTE — Chronic Care Management (AMB) (Signed)
  Chronic Care Management   Outreach Note  09/22/2021 Name: Gerald Rogers MRN: [id number] DOB: February 23, 1961  Gerald Rogers is a 60 y.o. year old male who is a primary care patient of Madilyn Fireman, Rene Kocher, MD. I reached out to Gerald Rogers by phone today in response to a referral sent by Gerald Rogers's primary care provider.  An unsuccessful telephone outreach was attempted today. The patient was referred to the case management team for assistance with care management and care coordination.   Follow Up Plan: A HIPAA compliant phone message was left for the patient providing contact information and requesting a return call.  If patient returns call to provider office, please advise to call Embedded Care Management Care Guide Reneta Niehaus at [place], [place] Management  Direct Dial: [phone]

## 2021-10-01 NOTE — Chronic Care Management (AMB) (Signed)
  Chronic Care Management   Outreach Note  10/01/2021 Name: Gerald Rogers MRN: [id number] DOB: 1961-03-01  Gerald Rogers is a 60 y.o. year old male who is a primary care patient of Madilyn Fireman, Rene Kocher, MD. I reached out to [name] by phone today in response to a referral sent by Mr. Gerald Rogers's primary care provider.  A second unsuccessful telephone outreach was attempted today. The patient was referred to the case management team for assistance with care management and care coordination.   Follow Up Plan: A HIPAA compliant phone message was left for the patient providing contact information and requesting a return call.  If patient returns call to provider office, please advise to call Embedded Care Management Care Guide Jocee Kissick at [place], [place] Management  Direct Dial: [phone]

## 2021-10-08 NOTE — Chronic Care Management (AMB) (Signed)
  Chronic Care Management   Outreach Note  10/08/2021 Name: Gerald Rogers MRN: [id number] DOB: [birth year]  Gerald Rogers is a 60 y.o. year old male who is a primary care patient of Madilyn Fireman, Rene Kocher, MD. I reached out to [name] by phone today in response to a referral sent by [name]'s primary care provider.  Third unsuccessful telephone outreach was attempted today. The patient was referred to the case management team for assistance with care management and care coordination. The patient's primary care provider has been notified of our unsuccessful attempts to make or maintain contact with the patient. The care management team is pleased to engage with this patient at any time in the future should he/she be interested in assistance from the care management team.   Follow Up Plan: We have been unable to make contact with the patient for follow up. The care management team is available to follow up with the patient after provider conversation with the patient regarding recommendation for care management engagement   Julian Hy, [place] Management  Direct Dial: [phone]

## 2021-12-15 ENCOUNTER — Ambulatory Visit: Payer: Medicare Other

## 2021-12-15 ENCOUNTER — Ambulatory Visit (INDEPENDENT_AMBULATORY_CARE_PROVIDER_SITE_OTHER): Payer: Medicare Other | Admitting: Family Medicine

## 2021-12-15 ENCOUNTER — Other Ambulatory Visit: Payer: Self-pay

## 2021-12-15 ENCOUNTER — Encounter: Payer: Self-pay | Admitting: Family Medicine

## 2021-12-15 VITALS — BP 148/88 | HR 81 | Temp 98.2°F | Resp 16 | Ht 64.0 in | Wt 141.0 lb

## 2021-12-15 DIAGNOSIS — I10 Essential (primary) hypertension: Secondary | ICD-10-CM | POA: Diagnosis not present

## 2021-12-15 DIAGNOSIS — I770 Arteriovenous fistula, acquired: Secondary | ICD-10-CM

## 2021-12-15 DIAGNOSIS — Z981 Arthrodesis status: Secondary | ICD-10-CM

## 2021-12-15 MED ORDER — AMLODIPINE BESYLATE 2.5 MG PO TABS: 2.5000 mg | ORAL_TABLET | Freq: Every day | ORAL | 0 refills | Status: DC

## 2021-12-15 NOTE — Patient Instructions (Signed)
Return in 2 weeks for Nurse visit for BP check

## 2021-12-15 NOTE — Progress Notes (Signed)
Patient stated he will schedule Colonoscopy.

## 2021-12-15 NOTE — Assessment & Plan Note (Signed)
BP improved upon repeat but still not well controlled and it does not look as good as it did when he came in a couple of months ago I am not sure really what the differences.  I really do not want to go up on the HCTZ component of his medication because he already has some mild hyponatremia.  So we will add 2.5 mg of amlodipine instead and have him come back in about 2 weeks for nurse visit.  I am glad that he is up and moving and more active.  Continue to work on [employer].  He already eats a low-salt diet.

## 2021-12-15 NOTE — Assessment & Plan Note (Signed)
Has been having more persistent daily pain.  No injury or trauma to suspect any dysfunction with his hardware at this point.  Okay to take his current level of Tylenol but will need to monitor liver function.  We did discuss working on doing some stretches and exercises on his own at home and with the least help strengthen the muscles.  If not improving then we can always get some up-to-date imaging if needed.

## 2021-12-15 NOTE — Progress Notes (Signed)
Established Patient Office Visit  Subjective:  Patient ID: Gerald Rogers, male    DOB: [birth year]  Age: 61 y.o. MRN: [id number]  CC:  Chief Complaint  Patient presents with   Hypertension    Follow up    Neck Pain    Neck, pain radiates down to shoulders. Patient states he has had neck pain since having neck surgery. Patient would like to discuss medication to help with pain.     HPI [name] presents for   Hypertension- Pt denies chest pain, SOB, dizziness, or heart palpitations.  Taking meds as directed w/o problems.  Denies medication side effects.    He is also been having some neck pain.  Prior history of cervical fusion he has been relying on Tylenol taking about 1500 mg daily total on average.  Sometimes less sometimes a little bit more but never more than 3 g daily.  He wanted to make sure that it was safe to do this it does help his discomfort.  He also says sitting in his chair at home and leaning back and resting his head back actually provide some relief as well it is more bothersome when he is up and he is more active.  He has gone for a recent eye exam.  Past Medical History:  Diagnosis Date   Anxiety    Arteriovenous fistula of spinal cord vessels    Arthritis    Headache    Hyperlipidemia    Hypertension     Past Surgical History:  Procedure Laterality Date   angiogram spinal     ANTERIOR CERVICAL DECOMP/DISCECTOMY FUSION N/A 03/23/2018   Procedure: ANTERIOR CERVICAL DECOMPRESSION/DISCECTOMY FUSION CERVICAL FOUR-FIVE, CERVICAL FIVE-SIX;  Surgeon: Melina Schools, MD;  Location: [place];  Service: Orthopedics;  Laterality: N/A;  3 hrs   EYE SURGERY     removal of foreign body   VAGAL NERVE STIMULATOR REMOVAL  09/2015   Dr. Gloriann Loan     Family History  Problem Relation Age of Onset   Heart disease Mother 46   Hypertension Mother    Cancer Father    Stroke Maternal Aunt     Social History   Socioeconomic History   Marital status: Married     Spouse name: Not on file   Number of children: Not on file   Years of education: Not on file   Highest education level: Not on file  Occupational History   Not on file  Tobacco Use   Smoking status: Former    Packs/day: 1.00    Types: Cigarettes, E-cigarettes    Start date: 12/13/1970    Quit date: 02/11/2018    Years since quitting: 3.8   Smokeless tobacco: Current  Vaping Use   Vaping Use: Every day  Substance and Sexual Activity   Alcohol use: Yes    Alcohol/week: 0.0 standard drinks   Drug use: No   Sexual activity: Yes    Partners: Female  Other Topics Concern   Not on file  Social History Narrative   No regular exercise.     Social Determinants of Health   Financial Resource Strain: Not on file  Food Insecurity: Not on file  Transportation Needs: Not on file  Physical Activity: Not on file  Stress: Not on file  Social Connections: Not on file  Intimate Partner Violence: Not on file    Outpatient Medications Prior to Visit  Medication Sig Dispense Refill   acetaminophen (TYLENOL) 500 MG tablet Take 500 mg  by mouth every 6 (six) hours as needed.     aspirin EC 81 MG tablet Take 81 mg by mouth daily. Swallow whole.     losartan-hydrochlorothiazide (HYZAAR) 100-12.5 MG tablet Take 1 tablet by mouth daily. 90 tablet 1   pravastatin (PRAVACHOL) 20 MG tablet Take 1 tablet (20 mg total) by mouth daily. 90 tablet 3   No facility-administered medications prior to visit.    Allergies  Allergen Reactions   Cymbalta [Duloxetine Hcl] Other (See Comments)    Generalized weakness and mental status changes   Klonopin [Clonazepam] Other (See Comments)    Mental status changes    ROS Review of Systems    Objective:    Physical Exam Constitutional:      Appearance: Normal appearance. He is well-developed.  HENT:     Head: Normocephalic and atraumatic.  Cardiovascular:     Rate and Rhythm: Normal rate and regular rhythm.     Heart sounds: Normal heart sounds.   Pulmonary:     Effort: Pulmonary effort is normal.     Breath sounds: Normal breath sounds.  Skin:    General: Skin is warm and dry.  Neurological:     Mental Status: He is alert and oriented to person, place, and time. Mental status is at baseline.  Psychiatric:        Behavior: Behavior normal.    BP (!) 148/88    Pulse 81    Temp 98.2 F (36.8 C)    Resp 16    Ht 5' 4"  (1.626 m)    Wt 141 lb (64 kg)    SpO2 97%    BMI 24.20 kg/m  Wt Readings from Last 3 Encounters:  12/15/21 141 lb (64 kg)  08/31/21 145 lb (65.8 kg)  02/25/21 147 lb (66.7 kg)     There are no preventive care reminders to display for this patient.  There are no preventive care reminders to display for this patient.  No results found for: TSH Lab Results  Component Value Date   WBC 12.2 (H) 02/25/2021   HGB 17.7 (H) 02/25/2021   HCT 51.9 (H) 02/25/2021   MCV 90.3 02/25/2021   PLT 249 02/25/2021   Lab Results  Component Value Date   NA 133 (L) 08/31/2021   K 4.8 08/31/2021   CO2 32 08/31/2021   GLUCOSE 84 08/31/2021   BUN 11 08/31/2021   CREATININE 0.87 08/31/2021   BILITOT 0.3 02/25/2021   ALKPHOS 87 02/15/2017   AST 18 02/25/2021   ALT 28 02/25/2021   PROT 7.1 02/25/2021   ALBUMIN 4.2 02/15/2017   CALCIUM 10.6 (H) 08/31/2021   ANIONGAP 13 03/20/2018   EGFR 98 08/31/2021   Lab Results  Component Value Date   CHOL 148 02/25/2021   Lab Results  Component Value Date   HDL 47 02/25/2021   Lab Results  Component Value Date   LDLCALC 72 02/25/2021   Lab Results  Component Value Date   TRIG 192 (H) 02/25/2021   Lab Results  Component Value Date   CHOLHDL 3.1 02/25/2021   No results found for: HGBA1C    Assessment & Plan:   Problem List Items Addressed This Visit       Cardiovascular and Mediastinum   Benign essential hypertension - Primary    BP improved upon repeat but still not well controlled and it does not look as good as it did when he came in a couple of months ago  I am  not sure really what the differences.  I really do not want to go up on the HCTZ component of his medication because he already has some mild hyponatremia.  So we will add 2.5 mg of amlodipine instead and have him come back in about 2 weeks for nurse visit.  I am glad that he is up and moving and more active.  Continue to work on [employer].  He already eats a low-salt diet.      Relevant Medications   amLODipine (NORVASC) 2.5 MG tablet   Arteriovenous fistula (HCC)    Followed by neurology.      Relevant Medications   amLODipine (NORVASC) 2.5 MG tablet     Other   S/P cervical spinal fusion    Has been having more persistent daily pain.  No injury or trauma to suspect any dysfunction with his hardware at this point.  Okay to take his current level of Tylenol but will need to monitor liver function.  We did discuss working on doing some stretches and exercises on his own at home and with the least help strengthen the muscles.  If not improving then we can always get some up-to-date imaging if needed.       Meds ordered this encounter  Medications   amLODipine (NORVASC) 2.5 MG tablet    Sig: Take 1 tablet (2.5 mg total) by mouth daily.    Dispense:  90 tablet    Refill:  0    Follow-up: No follow-ups on file.    Beatrice Lecher, MD

## 2021-12-15 NOTE — Assessment & Plan Note (Signed)
Followed by neurology.   

## 2021-12-29 ENCOUNTER — Ambulatory Visit (INDEPENDENT_AMBULATORY_CARE_PROVIDER_SITE_OTHER): Payer: Medicare Other | Admitting: Family Medicine

## 2021-12-29 ENCOUNTER — Other Ambulatory Visit: Payer: Self-pay

## 2021-12-29 VITALS — BP 131/90 | HR 83

## 2021-12-29 DIAGNOSIS — I10 Essential (primary) hypertension: Secondary | ICD-10-CM | POA: Diagnosis not present

## 2021-12-29 NOTE — Progress Notes (Signed)
Established Patient Office Visit  Subjective:  Patient ID: Gerald Rogers, male    DOB: 10-20-1961  Age: 61 y.o. MRN: [id number]  CC:  Chief Complaint  Patient presents with   Hypertension    HPI Gerald Rogers presents for blood pressure check. Denies swelling, problems with medication, chest pain or shortness of breath.   Past Medical History:  Diagnosis Date   Anxiety    Arteriovenous fistula of spinal cord vessels    Arthritis    Headache    Hyperlipidemia    Hypertension     Past Surgical History:  Procedure Laterality Date   angiogram spinal     ANTERIOR CERVICAL DECOMP/DISCECTOMY FUSION N/A 03/23/2018   Procedure: ANTERIOR CERVICAL DECOMPRESSION/DISCECTOMY FUSION CERVICAL FOUR-FIVE, CERVICAL FIVE-SIX;  Surgeon: Melina Schools, MD;  Location: [place];  Service: Orthopedics;  Laterality: N/A;  3 hrs   EYE SURGERY     removal of foreign body   VAGAL NERVE STIMULATOR REMOVAL  09/2015   Dr. Gloriann Loan     Family History  Problem Relation Age of Onset   Heart disease Mother 61   Hypertension Mother    Cancer Father    Stroke Maternal Aunt     Social History   Socioeconomic History   Marital status: Married    Spouse name: Not on file   Number of children: Not on file   Years of education: Not on file   Highest education level: Not on file  Occupational History   Not on file  Tobacco Use   Smoking status: Former    Packs/day: 1.00    Types: Cigarettes, E-cigarettes    Start date: 12/13/1970    Quit date: 02/11/2018    Years since quitting: 3.8   Smokeless tobacco: Current  Vaping Use   Vaping Use: Every day  Substance and Sexual Activity   Alcohol use: Yes    Alcohol/week: 0.0 standard drinks   Drug use: No   Sexual activity: Yes    Partners: Female  Other Topics Concern   Not on file  Social History Narrative   No regular exercise.     Social Determinants of Health   Financial Resource Strain: Not on file  Food Insecurity: Not on file   Transportation Needs: Not on file  Physical Activity: Not on file  Stress: Not on file  Social Connections: Not on file  Intimate Partner Violence: Not on file    Outpatient Medications Prior to Visit  Medication Sig Dispense Refill   acetaminophen (TYLENOL) 500 MG tablet Take 500 mg by mouth every 6 (six) hours as needed.     amLODipine (NORVASC) 2.5 MG tablet Take 1 tablet (2.5 mg total) by mouth daily. 90 tablet 0   aspirin EC 81 MG tablet Take 81 mg by mouth daily. Swallow whole.     losartan-hydrochlorothiazide (HYZAAR) 100-12.5 MG tablet Take 1 tablet by mouth daily. 90 tablet 1   pravastatin (PRAVACHOL) 20 MG tablet Take 1 tablet (20 mg total) by mouth daily. 90 tablet 3   No facility-administered medications prior to visit.    Allergies  Allergen Reactions   Cymbalta [Duloxetine Hcl] Other (See Comments)    Generalized weakness and mental status changes   Klonopin [Clonazepam] Other (See Comments)    Mental status changes    ROS Review of Systems    Objective:    Physical Exam  BP 131/90    Pulse 83    SpO2 98%  Wt Readings from Last  3 Encounters:  12/15/21 141 lb (64 kg)  08/31/21 145 lb (65.8 kg)  02/25/21 147 lb (66.7 kg)     There are no preventive care reminders to display for this patient.  There are no preventive care reminders to display for this patient.  No results found for: TSH Lab Results  Component Value Date   WBC 12.2 (H) 02/25/2021   HGB 17.7 (H) 02/25/2021   HCT 51.9 (H) 02/25/2021   MCV 90.3 02/25/2021   PLT 249 02/25/2021   Lab Results  Component Value Date   NA 133 (L) 08/31/2021   K 4.8 08/31/2021   CO2 32 08/31/2021   GLUCOSE 84 08/31/2021   BUN 11 08/31/2021   CREATININE 0.87 08/31/2021   BILITOT 0.3 02/25/2021   ALKPHOS 87 02/15/2017   AST 18 02/25/2021   ALT 28 02/25/2021   PROT 7.1 02/25/2021   ALBUMIN 4.2 02/15/2017   CALCIUM 10.6 (H) 08/31/2021   ANIONGAP 13 03/20/2018   EGFR 98 08/31/2021   Lab Results   Component Value Date   CHOL 148 02/25/2021   Lab Results  Component Value Date   HDL 47 02/25/2021   Lab Results  Component Value Date   LDLCALC 72 02/25/2021   Lab Results  Component Value Date   TRIG 192 (H) 02/25/2021   Lab Results  Component Value Date   CHOLHDL 3.1 02/25/2021   No results found for: HGBA1C    Assessment & Plan:  HTN - Patient advised to follow up in 2 weeks for a blood pressure check on the nurse schedule.   Problem List Items Addressed This Visit     Benign essential hypertension - Primary    No orders of the defined types were placed in this encounter.   Follow-up: Return in about 2 weeks (around 01/12/2022) for blood pressure check on a nurse visit. Gerald Rogers, Gerald Rogers, Gerald Rogers

## 2021-12-29 NOTE — Progress Notes (Signed)
Hypertension-repeat blood pressure does look much better but the diastolic still little bit elevated I like to have him come back in a couple more weeks to give the medication time to reach full efficacy.  If at that point it still elevated then we will try increasing the amlodipine to 5 mg.

## 2022-01-12 ENCOUNTER — Other Ambulatory Visit: Payer: Self-pay

## 2022-01-12 ENCOUNTER — Ambulatory Visit (INDEPENDENT_AMBULATORY_CARE_PROVIDER_SITE_OTHER): Payer: Medicare Other | Admitting: Family Medicine

## 2022-01-12 VITALS — BP 133/86 | HR 85

## 2022-01-12 DIAGNOSIS — I10 Essential (primary) hypertension: Secondary | ICD-10-CM

## 2022-01-12 MED ORDER — AMLODIPINE BESYLATE 5 MG PO TABS: 5.0000 mg | ORAL_TABLET | Freq: Every day | ORAL | 1 refills | Status: DC

## 2022-01-12 NOTE — Progress Notes (Signed)
Hypertension-blood pressure does look just a little bit better but still not quite at goal.  I am can go ahead and increase amlodipine to 5 mg.  Follow-up in 1 month for nurse visit for blood pressure check.  Beatrice Lecher, MD   Meds ordered this encounter  Medications   amLODipine (NORVASC) 5 MG tablet    Sig: Take 1 tablet (5 mg total) by mouth daily.    Dispense:  30 tablet    Refill:  1

## 2022-01-12 NOTE — Progress Notes (Signed)
Established Patient Office Visit  Subjective:  Patient ID: Gerald Rogers, male    DOB: 1961-03-15  Age: 61 y.o. MRN: [id number]  CC:  Hypertension  HPI Gerald Rogers is here for a blood pressure check for hypertension. Patient denies chest pain, SOB, and dizziness.  Past Medical History:  Diagnosis Date   Anxiety    Arteriovenous fistula of spinal cord vessels    Arthritis    Headache    Hyperlipidemia    Hypertension     Past Surgical History:  Procedure Laterality Date   angiogram spinal     ANTERIOR CERVICAL DECOMP/DISCECTOMY FUSION N/A 03/23/2018   Procedure: ANTERIOR CERVICAL DECOMPRESSION/DISCECTOMY FUSION CERVICAL FOUR-FIVE, CERVICAL FIVE-SIX;  Surgeon: Melina Schools, MD;  Location: [place];  Service: Orthopedics;  Laterality: N/A;  3 hrs   EYE SURGERY     removal of foreign body   VAGAL NERVE STIMULATOR REMOVAL  09/2015   Dr. Gloriann Loan     Family History  Problem Relation Age of Onset   Heart disease Mother 33   Hypertension Mother    Cancer Father    Stroke Maternal Aunt     Social History   Socioeconomic History   Marital status: Married    Spouse name: Not on file   Number of children: Not on file   Years of education: Not on file   Highest education level: Not on file  Occupational History   Not on file  Tobacco Use   Smoking status: Former    Packs/day: 1.00    Types: Cigarettes, E-cigarettes    Start date: 12/13/1970    Quit date: 02/11/2018    Years since quitting: 3.9   Smokeless tobacco: Current  Vaping Use   Vaping Use: Every day  Substance and Sexual Activity   Alcohol use: Yes    Alcohol/week: 0.0 standard drinks   Drug use: No   Sexual activity: Yes    Partners: Female  Other Topics Concern   Not on file  Social History Narrative   No regular exercise.     Social Determinants of Health   Financial Resource Strain: Not on file  Food Insecurity: Not on file  Transportation Needs: Not on file  Physical Activity: Not on file   Stress: Not on file  Social Connections: Not on file  Intimate Partner Violence: Not on file    Outpatient Medications Prior to Visit  Medication Sig Dispense Refill   acetaminophen (TYLENOL) 500 MG tablet Take 500 mg by mouth every 6 (six) hours as needed.     amLODipine (NORVASC) 2.5 MG tablet Take 1 tablet (2.5 mg total) by mouth daily. 90 tablet 0   aspirin EC 81 MG tablet Take 81 mg by mouth daily. Swallow whole.     losartan-hydrochlorothiazide (HYZAAR) 100-12.5 MG tablet Take 1 tablet by mouth daily. 90 tablet 1   pravastatin (PRAVACHOL) 20 MG tablet Take 1 tablet (20 mg total) by mouth daily. 90 tablet 3   No facility-administered medications prior to visit.    Allergies  Allergen Reactions   Cymbalta [Duloxetine Hcl] Other (See Comments)    Generalized weakness and mental status changes   Klonopin [Clonazepam] Other (See Comments)    Mental status changes    ROS Review of Systems    Objective:    Physical Exam  BP 133/86 (BP Location: Left Arm, Patient Position: Sitting, Cuff Size: Normal)    Pulse 85    SpO2 96%  Wt Readings from Last 3  Encounters:  12/15/21 141 lb (64 kg)  08/31/21 145 lb (65.8 kg)  02/25/21 147 lb (66.7 kg)     There are no preventive care reminders to display for this patient.  There are no preventive care reminders to display for this patient.  No results found for: TSH Lab Results  Component Value Date   WBC 12.2 (H) 02/25/2021   HGB 17.7 (H) 02/25/2021   HCT 51.9 (H) 02/25/2021   MCV 90.3 02/25/2021   PLT 249 02/25/2021   Lab Results  Component Value Date   NA 133 (L) 08/31/2021   K 4.8 08/31/2021   CO2 32 08/31/2021   GLUCOSE 84 08/31/2021   BUN 11 08/31/2021   CREATININE 0.87 08/31/2021   BILITOT 0.3 02/25/2021   ALKPHOS 87 02/15/2017   AST 18 02/25/2021   ALT 28 02/25/2021   PROT 7.1 02/25/2021   ALBUMIN 4.2 02/15/2017   CALCIUM 10.6 (H) 08/31/2021   ANIONGAP 13 03/20/2018   EGFR 98 08/31/2021   Lab Results   Component Value Date   CHOL 148 02/25/2021   Lab Results  Component Value Date   HDL 47 02/25/2021   Lab Results  Component Value Date   LDLCALC 72 02/25/2021   Lab Results  Component Value Date   TRIG 192 (H) 02/25/2021   Lab Results  Component Value Date   CHOLHDL 3.1 02/25/2021   No results found for: HGBA1C    Assessment & Plan:  Patient advised to schedule a follow up appointment with Dr. Madilyn Fireman in 3 Months. Problem List Items Addressed This Visit   None   No orders of the defined types were placed in this encounter.   Follow-up: Follow up in 3 months   Beverlee Nims

## 2022-01-12 NOTE — Progress Notes (Signed)
Left message for a return call

## 2022-02-04 NOTE — Progress Notes (Signed)
Left message advising of recommendation and for a return call.

## 2022-02-10 ENCOUNTER — Other Ambulatory Visit: Payer: Self-pay | Admitting: Family Medicine

## 2022-02-18 ENCOUNTER — Encounter: Payer: Self-pay | Admitting: Family Medicine

## 2022-02-18 NOTE — Progress Notes (Signed)
Left message for a return call

## 2022-02-18 NOTE — Progress Notes (Signed)
Mailed letter °

## 2022-02-19 ENCOUNTER — Other Ambulatory Visit: Payer: Self-pay | Admitting: Family Medicine

## 2022-03-01 ENCOUNTER — Encounter: Payer: Self-pay | Admitting: Family Medicine

## 2022-03-01 ENCOUNTER — Ambulatory Visit (INDEPENDENT_AMBULATORY_CARE_PROVIDER_SITE_OTHER): Payer: Medicare Other | Admitting: Family Medicine

## 2022-03-01 ENCOUNTER — Other Ambulatory Visit: Payer: Self-pay

## 2022-03-01 VITALS — BP 147/85 | HR 71 | Resp 18 | Ht 64.0 in | Wt 146.0 lb

## 2022-03-01 DIAGNOSIS — Z1211 Encounter for screening for malignant neoplasm of colon: Secondary | ICD-10-CM | POA: Diagnosis not present

## 2022-03-01 DIAGNOSIS — Z125 Encounter for screening for malignant neoplasm of prostate: Secondary | ICD-10-CM | POA: Diagnosis not present

## 2022-03-01 DIAGNOSIS — E7800 Pure hypercholesterolemia, unspecified: Secondary | ICD-10-CM | POA: Diagnosis not present

## 2022-03-01 DIAGNOSIS — I10 Essential (primary) hypertension: Secondary | ICD-10-CM | POA: Diagnosis not present

## 2022-03-01 DIAGNOSIS — Z8546 Personal history of malignant neoplasm of prostate: Secondary | ICD-10-CM | POA: Diagnosis not present

## 2022-03-01 DIAGNOSIS — R0789 Other chest pain: Secondary | ICD-10-CM

## 2022-03-01 MED ORDER — AMLODIPINE BESYLATE 5 MG PO TABS: 5.0000 mg | ORAL_TABLET | Freq: Every day | ORAL | 3 refills | Status: DC

## 2022-03-01 MED ORDER — LOSARTAN POTASSIUM-HCTZ 100-12.5 MG PO TABS: 1.0000 | ORAL_TABLET | Freq: Every day | ORAL | 3 refills | Status: DC

## 2022-03-01 MED ORDER — PRAVASTATIN SODIUM 20 MG PO TABS: 20.0000 mg | ORAL_TABLET | Freq: Every day | ORAL | 3 refills | Status: DC

## 2022-03-01 NOTE — Assessment & Plan Note (Signed)
Pressure is a little elevated today.  Last time he was here was at goal.  Follow-up in 2 weeks for nurse visit for repeat blood pressure check or if he is able to get in with cardiology they can check it there.  We do have room to increase his HCTZ to 25 mg if needed. ?

## 2022-03-01 NOTE — Progress Notes (Addendum)
? ?Established Patient Office Visit ? ?Subjective:  ?Patient ID: Gerald Rogers, male    DOB: 1961/02/02  Age: 61 y.o. MRN: [id number] ? ?CC:  ?Chief Complaint  ?Patient presents with  ? Hypertension  ?  Follow up   ? ? ?HPI ?Gerald Rogers presents for  ? ?Hypertension- Pt denies chest pain, SOB, dizziness, or heart palpitations.  Taking meds as directed w/o problems.  Denies medication side effects.   ?  ?Reports some intermittent chest pain on and off usually would last for about an hour it felt like twisting and tightening and squeezing out a towel in his chest sometimes he would actually have pain radiating down to his left arm.  No diaphoresis.  He said it just seemed to occur randomly usually during the day.  He in fact he went up to a full aspirin about 2 weeks ago and has not noticed it since then. ? ?He reports GERD often especially at night and in the evenings usually just takes a Tums. ? ?Past Medical History:  ?Diagnosis Date  ? Anxiety   ? Arteriovenous fistula of spinal cord vessels   ? Arthritis   ? Headache   ? Hyperlipidemia   ? Hypertension   ? ? ?Past Surgical History:  ?Procedure Laterality Date  ? angiogram spinal    ? ANTERIOR CERVICAL DECOMP/DISCECTOMY FUSION N/A 03/23/2018  ? Procedure: ANTERIOR CERVICAL DECOMPRESSION/DISCECTOMY FUSION CERVICAL FOUR-FIVE, CERVICAL FIVE-SIX;  Surgeon: Melina Schools, MD;  Location: [facility];  Service: Orthopedics;  Laterality: N/A;  3 hrs  ? EYE SURGERY    ? removal of foreign body  ? VAGAL NERVE STIMULATOR REMOVAL  09/2015  ? Dr. Gloriann Loan   ? ? ?Family History  ?Problem Relation Age of Onset  ? Heart disease Mother 37  ? Hypertension Mother   ? Cancer Father   ? Stroke Maternal Aunt   ? ? ?Social History  ? ?Socioeconomic History  ? Marital status: Married  ?  Spouse name: Not on file  ? Number of children: Not on file  ? Years of education: Not on file  ? Highest education level: Not on file  ?Occupational History  ? Not on file  ?Tobacco Use  ? Smoking status:  Every Day  ?  Packs/day: 1.00  ?  Types: Cigarettes, E-cigarettes  ?  Start date: 12/13/1970  ?  Last attempt to quit: 02/11/2018  ?  Years since quitting: 4.0  ? Smokeless tobacco: Current  ? Tobacco comments:  ?  1 pack a day   ?Vaping Use  ? Vaping Use: Every day  ?Substance and Sexual Activity  ? Alcohol use: Yes  ?  Alcohol/week: 0.0 standard drinks  ? Drug use: No  ? Sexual activity: Yes  ?  Partners: Female  ?Other Topics Concern  ? Not on file  ?Social History Narrative  ? No regular exercise.    ? ?Social Determinants of Health  ? ?Financial Resource Strain: Not on file  ?Food Insecurity: Not on file  ?Transportation Needs: Not on file  ?Physical Activity: Not on file  ?Stress: Not on file  ?Social Connections: Not on file  ?Intimate Partner Violence: Not on file  ? ? ?Outpatient Medications Prior to Visit  ?Medication Sig Dispense Refill  ? acetaminophen (TYLENOL) 500 MG tablet Take 500 mg by mouth every 6 (six) hours as needed.    ? aspirin EC 81 MG tablet Take 325 mg by mouth daily. Swallow whole. Patient is taking 325 mg  aspirin    ? amLODipine (NORVASC) 5 MG tablet TAKE 1 TABLET (5 MG TOTAL) BY MOUTH DAILY. 30 tablet 0  ? losartan-hydrochlorothiazide (HYZAAR) 100-12.5 MG tablet TAKE 1 TABLET BY MOUTH EVERY DAY 30 tablet 0  ? pravastatin (PRAVACHOL) 20 MG tablet Take 1 tablet (20 mg total) by mouth daily. 90 tablet 3  ? ?No facility-administered medications prior to visit.  ? ? ?Allergies  ?Allergen Reactions  ? Cymbalta [Duloxetine Hcl] Other (See Comments)  ?  Generalized weakness and mental status changes  ? Klonopin [Clonazepam] Other (See Comments)  ?  Mental status changes  ? ? ?ROS ?Review of Systems ? ?  ?Objective:  ?  ?Physical Exam ? ?BP (!) 147/85   Pulse 71   Resp 18   Ht 5' 4" (1.626 m)   Wt 146 lb (66.2 kg)   SpO2 97%   BMI 25.06 kg/m?  ?Wt Readings from Last 3 Encounters:  ?03/01/22 146 lb (66.2 kg)  ?12/15/21 141 lb (64 kg)  ?08/31/21 145 lb (65.8 kg)  ? ? ? ?Health Maintenance Due   ?Topic Date Due  ? COLONOSCOPY (Pts 45-8yr Insurance coverage will need to be confirmed)  07/03/2020  ? ? ?There are no preventive care reminders to display for this patient. ? ?No results found for: TSH ?Lab Results  ?Component Value Date  ? WBC 12.2 (H) 02/25/2021  ? HGB 17.7 (H) 02/25/2021  ? HCT 51.9 (H) 02/25/2021  ? MCV 90.3 02/25/2021  ? PLT 249 02/25/2021  ? ?Lab Results  ?Component Value Date  ? NA 133 (L) 08/31/2021  ? K 4.8 08/31/2021  ? CO2 32 08/31/2021  ? GLUCOSE 84 08/31/2021  ? BUN 11 08/31/2021  ? CREATININE 0.87 08/31/2021  ? BILITOT 0.3 02/25/2021  ? ALKPHOS 87 02/15/2017  ? AST 18 02/25/2021  ? ALT 28 02/25/2021  ? PROT 7.1 02/25/2021  ? ALBUMIN 4.2 02/15/2017  ? CALCIUM 10.6 (H) 08/31/2021  ? ANIONGAP 13 03/20/2018  ? EGFR 98 08/31/2021  ? ?Lab Results  ?Component Value Date  ? CHOL 148 02/25/2021  ? ?Lab Results  ?Component Value Date  ? HDL 47 02/25/2021  ? ?Lab Results  ?Component Value Date  ? [place]72 02/25/2021  ? ?Lab Results  ?Component Value Date  ? TRIG 192 (H) 02/25/2021  ? ?Lab Results  ?Component Value Date  ? CHOLHDL 3.1 02/25/2021  ? ?No results found for: HGBA1C ? ?  ?Assessment & Plan:  ? ?Problem List Items Addressed This Visit   ? ?  ? Cardiovascular and Mediastinum  ? Benign essential hypertension - Primary  ?  Pressure is a little elevated today.  Last time he was here was at goal.  Follow-up in 2 weeks for nurse visit for repeat blood pressure check or if he is able to get in with cardiology they can check it there.  We do have room to increase his HCTZ to 25 mg if needed. ?  ?  ? Relevant Medications  ? amLODipine (NORVASC) 5 MG tablet  ? losartan-hydrochlorothiazide (HYZAAR) 100-12.5 MG tablet  ? pravastatin (PRAVACHOL) 20 MG tablet  ? Other Relevant Orders  ? CBC  ? COMPLETE METABOLIC PANEL WITH GFR  ? TSH  ?  ? Other  ? Hyperlipidemia  ? Relevant Medications  ? amLODipine (NORVASC) 5 MG tablet  ? losartan-hydrochlorothiazide (HYZAAR) 100-12.5 MG tablet  ?  pravastatin (PRAVACHOL) 20 MG tablet  ? Other Relevant Orders  ? CBC  ? Lipid panel  ? ?  Other Visit Diagnoses   ? ? Colon cancer screening      ? Relevant Orders  ? Cologuard  ? Screening for prostate cancer      ? Relevant Orders  ? PSA  ? Atypical chest pain      ? Relevant Orders  ? COMPLETE METABOLIC PANEL WITH GFR  ? Troponin I  ? TSH  ? Ambulatory referral to Cardiology  ? Personal history of malignant neoplasm of prostate      ? Relevant Orders  ? PSA  ? ?  ? ? ?Atypical chest pain-concerning for underlying CAD.  Will get EKG today.  We will also go ahead and get up-to-date blood work.  He is currently on a statin and an aspirin daily.  EKG today shows rate of 74 bpm, normal sinus rhythm with no acute ST-T wave changes.  No significant change from prior EKG on file.  I would like to refer him to cardiology though I am concerned about the potential for underlying CAD. ? ?Meds ordered this encounter  ?Medications  ? amLODipine (NORVASC) 5 MG tablet  ?  Sig: Take 1 tablet (5 mg total) by mouth daily.  ?  Dispense:  90 tablet  ?  Refill:  3  ? losartan-hydrochlorothiazide (HYZAAR) 100-12.5 MG tablet  ?  Sig: Take 1 tablet by mouth daily.  ?  Dispense:  90 tablet  ?  Refill:  3  ? pravastatin (PRAVACHOL) 20 MG tablet  ?  Sig: Take 1 tablet (20 mg total) by mouth daily.  ?  Dispense:  90 tablet  ?  Refill:  3  ? ? ?Follow-up: No follow-ups on file.  ? ? ?Beatrice Lecher, MD ?

## 2022-03-02 ENCOUNTER — Telehealth: Payer: Self-pay

## 2022-03-02 ENCOUNTER — Other Ambulatory Visit: Payer: Self-pay | Admitting: Family Medicine

## 2022-03-02 DIAGNOSIS — E7800 Pure hypercholesterolemia, unspecified: Secondary | ICD-10-CM

## 2022-03-02 LAB — CBC
HCT: 51.5 % — ABNORMAL HIGH (ref 38.5–50.0)
Hemoglobin: 17.5 g/dL — ABNORMAL HIGH (ref 13.2–17.1)
MCH: 30.7 pg (ref 27.0–33.0)
MCHC: 34 g/dL (ref 32.0–36.0)
MCV: 90.4 fL (ref 80.0–100.0)
MPV: 11.5 fL (ref 7.5–12.5)
Platelets: 259 10*3/uL (ref 140–400)
RBC: 5.7 10*6/uL (ref 4.20–5.80)
RDW: 13.8 % (ref 11.0–15.0)
WBC: 7.6 10*3/uL (ref 3.8–10.8)

## 2022-03-02 LAB — LIPID PANEL
Cholesterol: 151 mg/dL (ref <200)
HDL: 48 mg/dL (ref >=40)
LDL Cholesterol (Calc): 75 mg/dL (calc)
Non-HDL Cholesterol (Calc): 103 mg/dL (calc) (ref <130)
Total CHOL/HDL Ratio: 3.1 (calc) (ref <5.0)
Triglycerides: 188 mg/dL — ABNORMAL HIGH (ref <150)

## 2022-03-02 LAB — COMPLETE METABOLIC PANEL WITH GFR
AG Ratio: 1.6 (calc) (ref 1.0–2.5)
ALT: 26 U/L (ref 9–46)
AST: 18 U/L (ref 10–35)
Albumin: 4.6 g/dL (ref 3.6–5.1)
Alkaline phosphatase (APISO): 69 U/L (ref 35–144)
BUN: 11 mg/dL (ref 7–25)
CO2: 29 mmol/L (ref 20–32)
Calcium: 10.6 mg/dL — ABNORMAL HIGH (ref 8.6–10.3)
Chloride: 94 mmol/L — ABNORMAL LOW (ref 98–110)
Creat: 0.85 mg/dL (ref 0.70–1.35)
Globulin: 2.9 g/dL (calc) (ref 1.9–3.7)
Glucose, Bld: 85 mg/dL (ref 65–139)
Potassium: 4.6 mmol/L (ref 3.5–5.3)
Sodium: 133 mmol/L — ABNORMAL LOW (ref 135–146)
Total Bilirubin: 0.4 mg/dL (ref 0.2–1.2)
Total Protein: 7.5 g/dL (ref 6.1–8.1)
eGFR: 98 mL/min/{1.73_m2} (ref >=60)

## 2022-03-02 LAB — PSA: PSA: 0.77 ng/mL (ref <=4.00)

## 2022-03-02 LAB — TSH: TSH: 2.97 mIU/L (ref 0.40–4.50)

## 2022-03-02 LAB — TROPONIN I: Troponin I: 5 ng/L (ref <=47)

## 2022-03-02 MED ORDER — PRAVASTATIN SODIUM 40 MG PO TABS: 40.0000 mg | ORAL_TABLET | Freq: Every day | ORAL | 3 refills | Status: DC

## 2022-03-02 NOTE — Progress Notes (Signed)
Hi Jaycion, hemoglobin is on the higher end at 17.5.  It is usually secondary to smoking so just encourage you to continue to work on cutting back and quitting.  Sodium is still just borderline low a couple points off.  But that is where its been so nothing new.  Calcium level is just slightly elevated similar to what its been.  May have asked this before, but are you taking any extra calcium supplements?  Or taking something like Tums pretty frequently?  Liver enzymes are normal.   ? ?LDL cholesterol is a little bit of 70.  We want to get it less than 70 so I really like to increase your pravastatin to 40 mg.  So [place] send an updated prescription to your pharmacy.  If you just picked up the recent prescription yesterday or today then you can double up on the 20s.  Prostate test is normal.  Thyroid looks good.

## 2022-03-02 NOTE — Addendum Note (Signed)
Addended by: Narda Rutherford on: 03/02/2022 10:15 AM ? ? Modules accepted: Orders ? ?

## 2022-03-02 NOTE — Progress Notes (Signed)
Call patient: Heart enzyme was normal.

## 2022-03-02 NOTE — Telephone Encounter (Signed)
Pt states that Dr. Stanford Breed didn't have any appts until 04/21/2022. Please see if he can be seen sooner with [facility] per Dr. Gardiner Ramus referral notes.  Thank you!  Charyl Bigger, CMA ?

## 2022-03-15 DIAGNOSIS — Z1211 Encounter for screening for malignant neoplasm of colon: Secondary | ICD-10-CM | POA: Diagnosis not present

## 2022-03-23 LAB — COLOGUARD: COLOGUARD: NEGATIVE

## 2022-03-24 NOTE — Progress Notes (Signed)
Great news! Your Cologuard test is negative.  Recommend repeat colon cancer screening in 3 years.

## 2022-04-09 NOTE — Progress Notes (Signed)
? ? ?Referring-Catherine Metheney MD ?Reason for referral-chest pain ? ?HPI: 61 year old male for evaluation of chest pain at request of Beatrice Lecher MD.  Laboratories March 2023 showed TSH 2.97, hemoglobin 17.5, LDL 75, creatinine 0.85, potassium 4.6, normal liver functions and normal troponin.  Recently complained of chest pain.  Cardiology asked to evaluate.  Patient states that approximately 2 to 3 months ago he was having occasional chest pain and he associates that with decreasing his aspirin dose from 325 mg daily to 81 mg daily.  The pain was in the upper chest and shoulders as well as left upper extremity.  It would occur with exertion but would last 30 minutes to 1 hour.  It was not pleuritic, no associated symptoms and would resolve spontaneously.  He otherwise denies exertional chest pain, dyspnea on exertion, orthopnea, PND, pedal edema or syncope.  No claudication.  He has not had any further chest pain since increasing his aspirin back to 325 mg daily approximately 1-1/2 months ago. ? ?Current Outpatient Medications  ?Medication Sig Dispense Refill  ? acetaminophen (TYLENOL) 500 MG tablet Take 500 mg by mouth every 6 (six) hours as needed.    ? amLODipine (NORVASC) 5 MG tablet Take 1 tablet (5 mg total) by mouth daily. 90 tablet 3  ? aspirin EC 81 MG tablet Take 325 mg by mouth daily. Swallow whole. Patient is taking 325 mg aspirin    ? losartan-hydrochlorothiazide (HYZAAR) 100-12.5 MG tablet Take 1 tablet by mouth daily. 90 tablet 3  ? pravastatin (PRAVACHOL) 40 MG tablet Take 1 tablet (40 mg total) by mouth at bedtime. 90 tablet 3  ? ?No current facility-administered medications for this visit.  ? ? ?Allergies  ?Allergen Reactions  ? Cymbalta [Duloxetine Hcl] Other (See Comments)  ?  Generalized weakness and mental status changes  ? Klonopin [Clonazepam] Other (See Comments)  ?  Mental status changes  ? ? ? ?Past Medical History:  ?Diagnosis Date  ? Anxiety   ? Arteriovenous fistula of spinal  cord vessels   ? Arthritis   ? Headache   ? Hyperlipidemia   ? Hypertension   ? ? ?Past Surgical History:  ?Procedure Laterality Date  ? angiogram spinal    ? ANTERIOR CERVICAL DECOMP/DISCECTOMY FUSION N/A 03/23/2018  ? Procedure: ANTERIOR CERVICAL DECOMPRESSION/DISCECTOMY FUSION CERVICAL FOUR-FIVE, CERVICAL FIVE-SIX;  Surgeon: Melina Schools, MD;  Location: [facility];  Service: Orthopedics;  Laterality: N/A;  3 hrs  ? EYE SURGERY    ? removal of foreign body  ? VAGAL NERVE STIMULATOR REMOVAL  09/2015  ? Dr. Gloriann Loan   ? ? ?Social History  ? ?Socioeconomic History  ? Marital status: Divorced  ?  Spouse name: Not on file  ? Number of children: 1  ? Years of education: Not on file  ? Highest education level: Not on file  ?Occupational History  ? Not on file  ?Tobacco Use  ? Smoking status: Every Day  ?  Packs/day: 1.00  ?  Types: Cigarettes, E-cigarettes  ?  Start date: 12/13/1970  ?  Last attempt to quit: 02/11/2018  ?  Years since quitting: 4.1  ? Smokeless tobacco: Current  ? Tobacco comments:  ?  1 pack a day   ?Vaping Use  ? Vaping Use: Every day  ?Substance and Sexual Activity  ? Alcohol use: Yes  ?  Comment: 6-10 beers once/week  ? Drug use: No  ? Sexual activity: Yes  ?  Partners: Female  ?Other Topics Concern  ?  Not on file  ?Social History Narrative  ? No regular exercise.    ? ?Social Determinants of Health  ? ?Financial Resource Strain: Not on file  ?Food Insecurity: Not on file  ?Transportation Needs: Not on file  ?Physical Activity: Not on file  ?Stress: Not on file  ?Social Connections: Not on file  ?Intimate Partner Violence: Not on file  ? ? ?Family History  ?Problem Relation Age of Onset  ? Heart attack Mother   ? Heart disease Mother 36  ? Hypertension Mother   ? Cancer Father   ? Stroke Maternal Aunt   ? ? ?ROS: no fevers or chills, productive cough, hemoptysis, dysphasia, odynophagia, melena, hematochezia, dysuria, hematuria, rash, seizure activity, orthopnea, PND, pedal edema, claudication. Remaining systems  are negative. ? ?Physical Exam:  ? ?Blood pressure (!) 144/82, pulse 78, height '5\' 4"'$  (1.626 m), weight 142 lb (64.4 kg), SpO2 96 %. ? ?General:  Well developed/well nourished in NAD ?Skin warm/dry; tattoos ?Patient not depressed ?No peripheral clubbing ?Back-normal ?HEENT-normal/normal eyelids ?Neck supple/normal carotid upstroke bilaterally; no bruits; no JVD; no thyromegaly ?chest - CTA/ normal expansion ?CV - RRR/normal S1 and S2; no murmurs, rubs or gallops;  PMI nondisplaced ?Abdomen -NT/ND, no HSM, no mass, + bowel sounds, no bruit ?2+ femoral pulses, no bruits ?Ext-no edema, chords, 2+ DP ?Neuro-grossly nonfocal ? ?ECG -March 01, 2022-normal sinus rhythm with no ST changes.  Personally reviewed ? ?A/P ? ?1 chest pain-symptoms somewhat atypical but multiple risk factors including tobacco abuse, family history, hypertension and hyperlipidemia.  I will arrange a cardiac CTA to rule out obstructive coronary disease. ? ?2 hypertension-blood pressure elevated.  Increase amlodipine to 10 mg daily and follow. ? ?3 hyperlipidemia-continue statin.  If he has documented coronary disease on CTA will need to change to Crestor 40 mg daily. ? ?4 tobacco abuse-patient counseled on discontinuing. ? ?Kirk Ruths, MD ? ?

## 2022-04-21 ENCOUNTER — Ambulatory Visit (INDEPENDENT_AMBULATORY_CARE_PROVIDER_SITE_OTHER): Payer: Medicare Other | Admitting: Cardiology

## 2022-04-21 ENCOUNTER — Encounter: Payer: Self-pay | Admitting: Cardiology

## 2022-04-21 VITALS — BP 144/82 | HR 78 | Ht 64.0 in | Wt 142.0 lb

## 2022-04-21 DIAGNOSIS — I10 Essential (primary) hypertension: Secondary | ICD-10-CM | POA: Diagnosis not present

## 2022-04-21 DIAGNOSIS — E7800 Pure hypercholesterolemia, unspecified: Secondary | ICD-10-CM

## 2022-04-21 DIAGNOSIS — R072 Precordial pain: Secondary | ICD-10-CM

## 2022-04-21 MED ORDER — AMLODIPINE BESYLATE 10 MG PO TABS: 10.0000 mg | ORAL_TABLET | Freq: Every day | ORAL | 3 refills | Status: DC

## 2022-04-21 MED ORDER — METOPROLOL TARTRATE 100 MG PO TABS: ORAL_TABLET | ORAL | 0 refills | Status: DC

## 2022-04-21 NOTE — Patient Instructions (Signed)
Medication Instructions:  ? ?INCREASE AMLODIPINE TO 10 MG ONCE DAILY=2 OF THE 5 MG TABLETS ONCE DAILY ? ?*If you need a refill on your cardiac medications before your next appointment, please call your pharmacy* ? ? ?Testing/Procedures: ? ? ?Your cardiac CT will be scheduled at  ? ?[facility] ?[address] ?[place], [place] [address] ?[phone] ? ? ?If scheduled at [facility], please arrive at the [facility] and Children's Entrance (Entrance C2) of [facility] 30 minutes prior to test start time. ?You can use the FREE valet parking offered at entrance C (encouraged to control the heart rate for the test)  ?Proceed to the [facility] Radiology Department (first floor) to check-in and test prep. ? ?All radiology patients and guests should use entrance C2 at [facility], accessed from [facility], even though the hospital's physical address listed is [address]. ? ? ? ? ?Please follow these instructions carefully (unless otherwise directed): ? ?Hold all erectile dysfunction medications at least 3 days (72 hrs) prior to test. ? ?On the Night Before the Test: ?Be sure to Drink plenty of water. ?Do not consume any caffeinated/decaffeinated beverages or chocolate 12 hours prior to your test. ?Do not take any antihistamines 12 hours prior to your test. ? ? ?On the Day of the Test: ?Drink plenty of water until 1 hour prior to the test. ?Do not eat any food 4 hours prior to the test. ?You may take your regular medications prior to the test.  ?Take metoprolol (Lopressor) 100 MG two hours prior to test. ?HOLD Furosemide/Hydrochlorothiazide morning of the test. ?FEMALES- please wear underwire-free bra if available, avoid dresses & tight clothing ? ?     ?After the Test: ?Drink plenty of water. ?After receiving IV contrast, you may experience a mild flushed feeling. This is normal. ?On occasion, you may experience a mild rash up to 24 hours after the test. This is  not dangerous. If this occurs, you can take Benadryl 25 mg and increase your fluid intake. ?If you experience trouble breathing, this can be serious. If it is severe call 911 IMMEDIATELY. If it is mild, please call our office. ?If you take any of these medications: Glipizide/Metformin, Avandament, Glucavance, please do not take 48 hours after completing test unless otherwise instructed. ? ?We will call to schedule your test 2-4 weeks out understanding that some insurance companies will need an authorization prior to the service being performed.  ? ?For non-scheduling related questions, please contact the cardiac imaging nurse navigator should you have any questions/concerns: ?Marchia Bond, Cardiac Imaging Nurse Navigator ?Gordy Clement, Cardiac Imaging Nurse Navigator ?[facility] ?Direct Office Dial: [phone]  ? ?For scheduling needs, including cancellations and rescheduling, please call [place], [phone].  ? ? ?Follow-Up: ?At [facility], you and your health needs are our priority.  As part of our continuing mission to provide you with exceptional heart care, we have created designated Provider Care Teams.  These Care Teams include your primary Cardiologist (physician) and Advanced Practice Providers (APPs -  Physician Assistants and Nurse Practitioners) who all work together to provide you with the care you need, when you need it. ? ?We recommend signing up for the patient portal called "MyChart".  Sign up information is provided on this After Visit Summary.  MyChart is used to connect with patients for Virtual Visits (Telemedicine).  Patients are able to view lab/test results, encounter notes, upcoming appointments, etc.  Non-urgent  messages can be sent to your provider as well.   ?To learn more about what you can do with MyChart, go to [email or web address].   ? ?Your next appointment:   ?6 month(s) ? ?The format for your next appointment:   ?In Person ? ?Provider:    ?Kirk Ruths, MD  ? ? ? ?Important Information About Sugar ? ? ? ? ?  ?

## 2022-04-26 ENCOUNTER — Other Ambulatory Visit: Payer: Self-pay

## 2022-04-26 ENCOUNTER — Telehealth: Payer: Self-pay | Admitting: Cardiology

## 2022-04-26 DIAGNOSIS — Z79899 Other long term (current) drug therapy: Secondary | ICD-10-CM

## 2022-04-26 NOTE — Telephone Encounter (Signed)
Patient is having his CT done on 5/31.  He called stating he needs to have labs done prior to that, he needs order to be placed so he can have his lab done.  ?

## 2022-04-26 NOTE — Telephone Encounter (Signed)
Called pt to let him the orders have been placed for his lab orders. No answer at this time, left information on the machine. Will mail orders to pt.  ?

## 2022-05-06 DIAGNOSIS — Z79899 Other long term (current) drug therapy: Secondary | ICD-10-CM | POA: Diagnosis not present

## 2022-05-06 LAB — BASIC METABOLIC PANEL
BUN/Creatinine Ratio: 19 (ref 10–24)
BUN: 15 mg/dL (ref 8–27)
CO2: 27 mmol/L (ref 20–29)
Calcium: 10.6 mg/dL — ABNORMAL HIGH (ref 8.6–10.2)
Chloride: 92 mmol/L — ABNORMAL LOW (ref 96–106)
Creatinine, Ser: 0.79 mg/dL (ref 0.76–1.27)
Glucose: 78 mg/dL (ref 70–99)
Potassium: 4.8 mmol/L (ref 3.5–5.2)
Sodium: 131 mmol/L — ABNORMAL LOW (ref 134–144)
eGFR: 100 mL/min/{1.73_m2} (ref >59)

## 2022-05-11 ENCOUNTER — Telehealth (HOSPITAL_COMMUNITY): Payer: Self-pay | Admitting: *Deleted

## 2022-05-11 NOTE — Telephone Encounter (Signed)
Reaching out to patient to offer assistance regarding upcoming cardiac imaging study; pt verbalizes understanding of appt date/time, parking situation and where to check in, pre-test NPO status and medications ordered, and verified current allergies; name and call back number provided for further questions should they arise  Helena Sardo RN Navigator Cardiac Imaging [facility] [phone] office [phone] cell  Patient to take 100mg metoprolol tartrate two hours prior to his cardiac CT scan. He is aware to arrive at 12:30 pm. 

## 2022-05-11 NOTE — Telephone Encounter (Signed)
Attempted to call patient regarding upcoming cardiac CT appointment. °Left message on voicemail with name and callback number ° °Jullianna Gabor RN Navigator Cardiac Imaging °[facility] °[phone] Office °[phone] Cell ° °

## 2022-05-12 ENCOUNTER — Other Ambulatory Visit: Payer: Self-pay | Admitting: Internal Medicine

## 2022-05-12 ENCOUNTER — Encounter (HOSPITAL_COMMUNITY): Payer: Self-pay

## 2022-05-12 ENCOUNTER — Ambulatory Visit (HOSPITAL_COMMUNITY)
Admission: RE | Admit: 2022-05-12 | Discharge: 2022-05-12 | Disposition: A | Discharge status: Home / Self Care | Payer: Medicare Other | Source: Ambulatory Visit | Attending: Cardiology | Admitting: Cardiology

## 2022-05-12 DIAGNOSIS — I2510 Atherosclerotic heart disease of native coronary artery without angina pectoris: Secondary | ICD-10-CM

## 2022-05-12 DIAGNOSIS — R931 Abnormal findings on diagnostic imaging of heart and coronary circulation: Secondary | ICD-10-CM | POA: Insufficient documentation

## 2022-05-12 DIAGNOSIS — R072 Precordial pain: Secondary | ICD-10-CM | POA: Insufficient documentation

## 2022-05-12 MED ORDER — IOHEXOL 350 MG/ML SOLN
100.0000 mL | Freq: Once | INTRAVENOUS | Status: AC | PRN
  Administered 2022-05-12: 100 mL via INTRAVENOUS

## 2022-05-12 MED ORDER — NITROGLYCERIN 0.4 MG SL SUBL
0.8000 mg | SUBLINGUAL_TABLET | Freq: Once | SUBLINGUAL | Status: AC
  Administered 2022-05-12: 0.8 mg via SUBLINGUAL

## 2022-05-12 MED ORDER — NITROGLYCERIN 0.4 MG SL SUBL
SUBLINGUAL_TABLET | SUBLINGUAL | Status: AC
  Filled 2022-05-12: qty 2

## 2022-05-13 ENCOUNTER — Ambulatory Visit (HOSPITAL_COMMUNITY)
Admission: RE | Admit: 2022-05-13 | Discharge: 2022-05-13 | Disposition: A | Discharge status: Home / Self Care | Payer: Medicare Other | Source: Ambulatory Visit | Attending: Internal Medicine | Admitting: Internal Medicine

## 2022-05-13 ENCOUNTER — Ambulatory Visit (HOSPITAL_BASED_OUTPATIENT_CLINIC_OR_DEPARTMENT_OTHER)
Admission: RE | Admit: 2022-05-13 | Discharge: 2022-05-13 | Disposition: A | Discharge status: Home / Self Care | Payer: Medicare Other | Source: Ambulatory Visit | Attending: Internal Medicine | Admitting: Internal Medicine

## 2022-05-13 DIAGNOSIS — R931 Abnormal findings on diagnostic imaging of heart and coronary circulation: Secondary | ICD-10-CM | POA: Diagnosis not present

## 2022-05-13 DIAGNOSIS — R072 Precordial pain: Secondary | ICD-10-CM | POA: Diagnosis not present

## 2022-05-14 ENCOUNTER — Telehealth: Payer: Self-pay | Admitting: Cardiology

## 2022-05-14 NOTE — Telephone Encounter (Signed)
Lelon Perla, MD  05/14/2022  1:43 PM EDT     Schedule fuov with APP to arrange cath Kirk Ruths   Spoke to patient, aware of results.   Appointment scheduled 6/6 with Coletta Memos NP.    Patient aware of office location and verbalized understanding.

## 2022-05-14 NOTE — Telephone Encounter (Signed)
Patient returned call for his CT results.  

## 2022-05-17 NOTE — Progress Notes (Unsigned)
Cardiology Clinic Note   Patient Name: Gerald Rogers Date of Encounter: 05/18/2022  Primary Care Provider:  Hali Marry, MD Primary Cardiologist:  Kirk Ruths, MD  Patient Profile    Gerald Rogers 61 year old male presents to the clinic today for review of his abnormal coronary CTA.  Past Medical History    Past Medical History:  Diagnosis Date   Anxiety    Arteriovenous fistula of spinal cord vessels    Arthritis    Headache    Hyperlipidemia    Hypertension    Past Surgical History:  Procedure Laterality Date   angiogram spinal     ANTERIOR CERVICAL DECOMP/DISCECTOMY FUSION N/A 03/23/2018   Procedure: ANTERIOR CERVICAL DECOMPRESSION/DISCECTOMY FUSION CERVICAL FOUR-FIVE, CERVICAL FIVE-SIX;  Surgeon: Melina Schools, MD;  Location: [facility];  Service: Orthopedics;  Laterality: N/A;  3 hrs   EYE SURGERY     removal of foreign body   VAGAL NERVE STIMULATOR REMOVAL  09/2015   Dr. Gloriann Loan     Allergies  Allergies  Allergen Reactions   Cymbalta [Duloxetine Hcl] Other (See Comments)    Generalized weakness and mental status changes   Klonopin [Clonazepam] Other (See Comments)    Mental status changes    History of Present Illness    Gerald Rogers is a PMH of essential hypertension, AV fistula, GERD, tremor, hyperlipidemia, dysphonia, and abnormal coronary CTA.  He was noted to have chest discomfort and was referred by his PCP.  He was seen by Dr. Stanford Breed on 04/21/2022.  During that time he reported several weeks of occasional episodes of chest discomfort.  He noted this with decreasing his aspirin dose from 325 to 81 mg.  He noted pain in his upper chest and across the shoulders as well as his left upper extremity.  The discomfort would present during exertion and would last for 30 minutes to 1 hour.  It was not felt to be pleuritic, there were no associated symptoms and his discomfort would resolve spontaneously.  He denied other episodes of chest discomfort,  dyspnea, orthopnea, PND, and lower extremity swelling.  He denied claudication.  He denied further episodes of chest discomfort after increasing his aspirin to 325.  A coronary CTA was ordered which showed severe coronary disease in his mid RCA.  He presents to the clinic today for follow-up evaluation and review of his coronary CTA.  He states after increasing his aspirin to 325 he has not noticed any chest discomfort.  We reviewed his coronary CTA and he expressed understanding.  We reviewed the risks and benefits of cardiac catheterization.  He expressed understanding we used shared decision making to proceed with cardiac catheterization.  I reviewed the importance of smoking cessation, increase physical activity, and cholesterol management.  I will order a cardiac catheterization, CBC, BMP, atorvastatin, and plan follow-up for after his cardiac catheterization.  Today he denies chest pain, shortness of breath, lower extremity edema, fatigue, palpitations, melena, hematuria, hemoptysis, diaphoresis, weakness, presyncope, syncope, orthopnea, and PND.   Home Medications    Prior to Admission medications   Medication Sig Start Date End Date Taking? Authorizing Provider  acetaminophen (TYLENOL) 500 MG tablet Take 500 mg by mouth every 6 (six) hours as needed.    [provider]  amLODipine (NORVASC) 10 MG tablet Take 1 tablet (10 mg total) by mouth daily. 04/21/22   Lelon Perla, MD  aspirin EC 81 MG tablet Take 325 mg by mouth daily. Swallow whole. Patient is taking  325 mg aspirin    [provider]  losartan-hydrochlorothiazide (HYZAAR) 100-12.5 MG tablet Take 1 tablet by mouth daily. 03/01/22   Hali Marry, MD  metoprolol tartrate (LOPRESSOR) 100 MG tablet TAKE 2 HOURS PRIOR TO CT SCAN 04/21/22   Lelon Perla, MD  pravastatin (PRAVACHOL) 40 MG tablet Take 1 tablet (40 mg total) by mouth at bedtime. 03/02/22   Hali Marry, MD    Family History     Family History  Problem Relation Age of Onset   Heart attack Mother    Heart disease Mother 60   Hypertension Mother    Cancer Father    Stroke Maternal Aunt    He indicated that his mother is deceased. He indicated that his father is deceased. He indicated that the status of his maternal aunt is unknown.  Social History    Social History   Socioeconomic History   Marital status: Divorced    Spouse name: Not on file   Number of children: 1   Years of education: Not on file   Highest education level: Not on file  Occupational History   Not on file  Tobacco Use   Smoking status: Every Day    Packs/day: 1.00    Types: Cigarettes, E-cigarettes    Start date: 12/13/1970    Last attempt to quit: 02/11/2018    Years since quitting: 4.2   Smokeless tobacco: Current   Tobacco comments:    1 pack a day   Vaping Use   Vaping Use: Every day  Substance and Sexual Activity   Alcohol use: Yes    Comment: 6-10 beers once/week   Drug use: No   Sexual activity: Yes    Partners: Female  Other Topics Concern   Not on file  Social History Narrative   No regular exercise.     Social Determinants of Health   Financial Resource Strain: Not on file  Food Insecurity: Not on file  Transportation Needs: Not on file  Physical Activity: Not on file  Stress: Not on file  Social Connections: Not on file  Intimate Partner Violence: Not on file     Review of Systems    General:  No chills, fever, night sweats or weight changes.  Cardiovascular:  No chest pain, dyspnea on exertion, edema, orthopnea, palpitations, paroxysmal nocturnal dyspnea. Dermatological: No rash, lesions/masses Respiratory: No cough, dyspnea Urologic: No hematuria, dysuria Abdominal:   No nausea, vomiting, diarrhea, bright red blood per rectum, melena, or hematemesis Neurologic:  No visual changes, wkns, changes in mental status. All other systems reviewed and are otherwise negative except as noted above.  Physical  Exam    VS:  BP 126/62   Pulse 67   Ht '5\' 4"'$  (1.626 m)   Wt 143 lb (64.9 kg)   SpO2 95%   BMI 24.55 kg/m  , BMI Body mass index is 24.55 kg/m. GEN: Well nourished, well developed, in no acute distress. HEENT: normal. Neck: Supple, no JVD, carotid bruits, or masses. Cardiac: RRR, no murmurs, rubs, or gallops. No clubbing, cyanosis, edema.  Radials/DP/PT 2+ and equal bilaterally.  Respiratory:  Respirations regular and unlabored, clear to auscultation bilaterally. GI: Soft, nontender, nondistended, BS + x 4. MS: no deformity or atrophy. Skin: warm and dry, no rash. Neuro:  Strength and sensation are intact. Psych: Normal affect.  Accessory Clinical Findings    Recent Labs: 03/01/2022: ALT 26; Hemoglobin 17.5; Platelets 259; TSH 2.97 05/06/2022: BUN 15; Creatinine, Ser 0.79; Potassium  4.8; Sodium 131   Recent Lipid Panel    Component Value Date/Time   CHOL 151 03/01/2022 0000   TRIG 188 (H) 03/01/2022 0000   HDL 48 03/01/2022 0000   CHOLHDL 3.1 03/01/2022 0000   VLDL 70 (H) 07/23/2015 0944   LDLCALC 75 03/01/2022 0000    ECG personally reviewed by me today-none today.  Coronary CTA 05/12/2022 IMPRESSION: 1. Severe CAD in mid RCA, CADRADS = 4V. CT FFR will be performed and reported separately.   2. Vulnerable plaque characteristics in the proximal LAD with mild vessel stenosis.   3. Coronary calcium score is 1015, which places the patient in the 95th percentile for age and sex matched control.   4. Normal coronary origins with right dominance.   5.  Patent foramen ovale with left to right shunt.   6.  Aortic atherosclerosis.     Electronically Signed   By: Cherlynn Kaiser M.D.   On: 05/12/2022 17:27 FINDINGS: FFRct analysis was performed on the original cardiac CT angiogram dataset. Diagrammatic representation of the FFRct analysis is provided in a separate PDF document in PACS. This dictation was created using the PDF document and an interactive 3D model of  the results. 3D model is not available in the EMR/PACS. Normal FFR range is >0.80. Indeterminate (grey) zone is 0.76-0.80. FFR delta of 0.13 is considered significant.   1. Left Main: FFR = 0.97   2. LAD: Proximal FFR = 0.94, Mid FFR = 0.90, Distal FFR = 0.86, D1 FFR = 0.95, D2 FFR = not mapped 3. LCX: Proximal FFR = 0.96, distal FFR = not mapped, OM2 FFR = 0.90 4. RCA: Proximal FFR = 0.98, mid FFR =0.58, Distal FFR = 0.58   IMPRESSION: 1.  CT FFR analysis showed significant stenosis in the mid RCA.   RECOMMENDATIONS: Recommend cardiac catheterization if clinically indicated   Guideline-directed medical therapy and aggressive risk factor modification for secondary prevention of coronary artery disease.     Electronically Signed   By: Cherlynn Kaiser M.D.   On: 05/12/2022 17:34  Assessment & Plan   1.  Coronary artery disease-underwent coronary CTA 05/12/2022 which showed severe RCA disease and FFR of 0.58.  Details above. Continue amlodipine, aspirin, metoprolol Start atorvastatin Order cardiac catheterization Order BMP and CBC  Shared Decision Making/Informed Consent The risks [stroke (1 in 1000), death (1 in 1000), kidney failure [usually temporary] (1 in 500), bleeding (1 in 200), allergic reaction [possibly serious] (1 in 200)], benefits (diagnostic support and management of coronary artery disease) and alternatives of a cardiac catheterization were discussed in detail with Mr. Kernen and he is willing to proceed.  Essential hypertension-BP today 126/62.  Better controlled with the increased amlodipine. Continue amlodipine, losartan, hydrochlorothiazide, metoprolol Heart healthy low-sodium diet-salty 6 given Increase physical activity as tolerated  Hyperlipidemia-LDL 75 03/01/22 Increase rosuvastatin to 40 mg daily Heart healthy low-sodium diet-salty 6 given Increase physical activity as tolerated Repeat fasting lipids and LFTs in 6-8 weeks  Tobacco abuse-continues to  smoke 1 pack per day.  Smoking cessation strongly recommended Smoking cessation information given.  Disposition: Follow-up with Dr. Stanford Breed or me after cardiac catheterization   Jossie Ng. Orlan Aversa NP-C    05/18/2022, 3:01 PM [facility] [address] Office [phone] Fax [phone]  Notice: This dictation was prepared with Dragon dictation along with smaller phrase technology. Any transcriptional errors that result from this process are unintentional and may not be corrected upon review.  I  spent 15 minutes examining this patient, reviewing medications, and using patient centered shared decision making involving her cardiac care.  Prior to her visit I spent greater than 20 minutes reviewing her past medical history,  medications, and prior cardiac tests.

## 2022-05-17 NOTE — H&P (View-Only) (Signed)
Cardiology Clinic Note   Patient Name: Gerald Rogers Date of Encounter: 05/18/2022  Primary Care Provider:  Hali Marry, MD Primary Cardiologist:  Kirk Ruths, MD  Patient Profile    Gerald Rogers 61 year old male presents to the clinic today for review of his abnormal coronary CTA.  Past Medical History    Past Medical History:  Diagnosis Date   Anxiety    Arteriovenous fistula of spinal cord vessels    Arthritis    Headache    Hyperlipidemia    Hypertension    Past Surgical History:  Procedure Laterality Date   angiogram spinal     ANTERIOR CERVICAL DECOMP/DISCECTOMY FUSION N/A 03/23/2018   Procedure: ANTERIOR CERVICAL DECOMPRESSION/DISCECTOMY FUSION CERVICAL FOUR-FIVE, CERVICAL FIVE-SIX;  Surgeon: Melina Schools, MD;  Location: [facility];  Service: Orthopedics;  Laterality: N/A;  3 hrs   EYE SURGERY     removal of foreign body   VAGAL NERVE STIMULATOR REMOVAL  09/2015   Dr. Gloriann Loan     Allergies  Allergies  Allergen Reactions   Cymbalta [Duloxetine Hcl] Other (See Comments)    Generalized weakness and mental status changes   Klonopin [Clonazepam] Other (See Comments)    Mental status changes    History of Present Illness    Gerald Rogers is a PMH of essential hypertension, AV fistula, GERD, tremor, hyperlipidemia, dysphonia, and abnormal coronary CTA.  He was noted to have chest discomfort and was referred by his PCP.  He was seen by Dr. Stanford Breed on 04/21/2022.  During that time he reported several weeks of occasional episodes of chest discomfort.  He noted this with decreasing his aspirin dose from 325 to 81 mg.  He noted pain in his upper chest and across the shoulders as well as his left upper extremity.  The discomfort would present during exertion and would last for 30 minutes to 1 hour.  It was not felt to be pleuritic, there were no associated symptoms and his discomfort would resolve spontaneously.  He denied other episodes of chest discomfort,  dyspnea, orthopnea, PND, and lower extremity swelling.  He denied claudication.  He denied further episodes of chest discomfort after increasing his aspirin to 325.  A coronary CTA was ordered which showed severe coronary disease in his mid RCA.  He presents to the clinic today for follow-up evaluation and review of his coronary CTA.  He states after increasing his aspirin to 325 he has not noticed any chest discomfort.  We reviewed his coronary CTA and he expressed understanding.  We reviewed the risks and benefits of cardiac catheterization.  He expressed understanding we used shared decision making to proceed with cardiac catheterization.  I reviewed the importance of smoking cessation, increase physical activity, and cholesterol management.  I will order a cardiac catheterization, CBC, BMP, atorvastatin, and plan follow-up for after his cardiac catheterization.  Today he denies chest pain, shortness of breath, lower extremity edema, fatigue, palpitations, melena, hematuria, hemoptysis, diaphoresis, weakness, presyncope, syncope, orthopnea, and PND.   Home Medications    Prior to Admission medications   Medication Sig Start Date End Date Taking? Authorizing Provider  acetaminophen (TYLENOL) 500 MG tablet Take 500 mg by mouth every 6 (six) hours as needed.    [provider]  amLODipine (NORVASC) 10 MG tablet Take 1 tablet (10 mg total) by mouth daily. 04/21/22   Lelon Perla, MD  aspirin EC 81 MG tablet Take 325 mg by mouth daily. Swallow whole. Patient is taking  325 mg aspirin    [provider]  losartan-hydrochlorothiazide (HYZAAR) 100-12.5 MG tablet Take 1 tablet by mouth daily. 03/01/22   Hali Marry, MD  metoprolol tartrate (LOPRESSOR) 100 MG tablet TAKE 2 HOURS PRIOR TO CT SCAN 04/21/22   Lelon Perla, MD  pravastatin (PRAVACHOL) 40 MG tablet Take 1 tablet (40 mg total) by mouth at bedtime. 03/02/22   Hali Marry, MD    Family History     Family History  Problem Relation Age of Onset   Heart attack Mother    Heart disease Mother 78   Hypertension Mother    Cancer Father    Stroke Maternal Aunt    He indicated that his mother is deceased. He indicated that his father is deceased. He indicated that the status of his maternal aunt is unknown.  Social History    Social History   Socioeconomic History   Marital status: Divorced    Spouse name: Not on file   Number of children: 1   Years of education: Not on file   Highest education level: Not on file  Occupational History   Not on file  Tobacco Use   Smoking status: Every Day    Packs/day: 1.00    Types: Cigarettes, E-cigarettes    Start date: 12/13/1970    Last attempt to quit: 02/11/2018    Years since quitting: 4.2   Smokeless tobacco: Current   Tobacco comments:    1 pack a day   Vaping Use   Vaping Use: Every day  Substance and Sexual Activity   Alcohol use: Yes    Comment: 6-10 beers once/week   Drug use: No   Sexual activity: Yes    Partners: Female  Other Topics Concern   Not on file  Social History Narrative   No regular exercise.     Social Determinants of Health   Financial Resource Strain: Not on file  Food Insecurity: Not on file  Transportation Needs: Not on file  Physical Activity: Not on file  Stress: Not on file  Social Connections: Not on file  Intimate Partner Violence: Not on file     Review of Systems    General:  No chills, fever, night sweats or weight changes.  Cardiovascular:  No chest pain, dyspnea on exertion, edema, orthopnea, palpitations, paroxysmal nocturnal dyspnea. Dermatological: No rash, lesions/masses Respiratory: No cough, dyspnea Urologic: No hematuria, dysuria Abdominal:   No nausea, vomiting, diarrhea, bright red blood per rectum, melena, or hematemesis Neurologic:  No visual changes, wkns, changes in mental status. All other systems reviewed and are otherwise negative except as noted above.  Physical  Exam    VS:  BP 126/62   Pulse 67   Ht '5\' 4"'$  (1.626 m)   Wt 143 lb (64.9 kg)   SpO2 95%   BMI 24.55 kg/m  , BMI Body mass index is 24.55 kg/m. GEN: Well nourished, well developed, in no acute distress. HEENT: normal. Neck: Supple, no JVD, carotid bruits, or masses. Cardiac: RRR, no murmurs, rubs, or gallops. No clubbing, cyanosis, edema.  Radials/DP/PT 2+ and equal bilaterally.  Respiratory:  Respirations regular and unlabored, clear to auscultation bilaterally. GI: Soft, nontender, nondistended, BS + x 4. MS: no deformity or atrophy. Skin: warm and dry, no rash. Neuro:  Strength and sensation are intact. Psych: Normal affect.  Accessory Clinical Findings    Recent Labs: 03/01/2022: ALT 26; Hemoglobin 17.5; Platelets 259; TSH 2.97 05/06/2022: BUN 15; Creatinine, Ser 0.79; Potassium  4.8; Sodium 131   Recent Lipid Panel    Component Value Date/Time   CHOL 151 03/01/2022 0000   TRIG 188 (H) 03/01/2022 0000   HDL 48 03/01/2022 0000   CHOLHDL 3.1 03/01/2022 0000   VLDL 70 (H) 07/23/2015 0944   LDLCALC 75 03/01/2022 0000    ECG personally reviewed by me today-none today.  Coronary CTA 05/12/2022 IMPRESSION: 1. Severe CAD in mid RCA, CADRADS = 4V. CT FFR will be performed and reported separately.   2. Vulnerable plaque characteristics in the proximal LAD with mild vessel stenosis.   3. Coronary calcium score is 1015, which places the patient in the 95th percentile for age and sex matched control.   4. Normal coronary origins with right dominance.   5.  Patent foramen ovale with left to right shunt.   6.  Aortic atherosclerosis.     Electronically Signed   By: Cherlynn Kaiser M.D.   On: 05/12/2022 17:27 FINDINGS: FFRct analysis was performed on the original cardiac CT angiogram dataset. Diagrammatic representation of the FFRct analysis is provided in a separate PDF document in PACS. This dictation was created using the PDF document and an interactive 3D model of  the results. 3D model is not available in the EMR/PACS. Normal FFR range is >0.80. Indeterminate (grey) zone is 0.76-0.80. FFR delta of 0.13 is considered significant.   1. Left Main: FFR = 0.97   2. LAD: Proximal FFR = 0.94, Mid FFR = 0.90, Distal FFR = 0.86, D1 FFR = 0.95, D2 FFR = not mapped 3. LCX: Proximal FFR = 0.96, distal FFR = not mapped, OM2 FFR = 0.90 4. RCA: Proximal FFR = 0.98, mid FFR =0.58, Distal FFR = 0.58   IMPRESSION: 1.  CT FFR analysis showed significant stenosis in the mid RCA.   RECOMMENDATIONS: Recommend cardiac catheterization if clinically indicated   Guideline-directed medical therapy and aggressive risk factor modification for secondary prevention of coronary artery disease.     Electronically Signed   By: Cherlynn Kaiser M.D.   On: 05/12/2022 17:34  Assessment & Plan   1.  Coronary artery disease-underwent coronary CTA 05/12/2022 which showed severe RCA disease and FFR of 0.58.  Details above. Continue amlodipine, aspirin, metoprolol Start atorvastatin Order cardiac catheterization Order BMP and CBC  Shared Decision Making/Informed Consent The risks [stroke (1 in 1000), death (1 in 1000), kidney failure [usually temporary] (1 in 500), bleeding (1 in 200), allergic reaction [possibly serious] (1 in 200)], benefits (diagnostic support and management of coronary artery disease) and alternatives of a cardiac catheterization were discussed in detail with Gerald Rogers and he is willing to proceed.  Essential hypertension-BP today 126/62.  Better controlled with the increased amlodipine. Continue amlodipine, losartan, hydrochlorothiazide, metoprolol Heart healthy low-sodium diet-salty 6 given Increase physical activity as tolerated  Hyperlipidemia-LDL 75 03/01/22 Increase rosuvastatin to 40 mg daily Heart healthy low-sodium diet-salty 6 given Increase physical activity as tolerated Repeat fasting lipids and LFTs in 6-8 weeks  Tobacco abuse-continues to  smoke 1 pack per day.  Smoking cessation strongly recommended Smoking cessation information given.  Disposition: Follow-up with Dr. Stanford Breed or me after cardiac catheterization   Jossie Ng. Stephie Xu NP-C    05/18/2022, 3:01 PM [facility] [place] Office [phone] Fax [phone]  Notice: This dictation was prepared with Dragon dictation along with smaller phrase technology. Any transcriptional errors that result from this process are unintentional and may not be corrected upon review.  I  spent 15 minutes examining this patient, reviewing medications, and using patient centered shared decision making involving her cardiac care.  Prior to her visit I spent greater than 20 minutes reviewing her past medical history,  medications, and prior cardiac tests.

## 2022-05-18 ENCOUNTER — Ambulatory Visit (INDEPENDENT_AMBULATORY_CARE_PROVIDER_SITE_OTHER): Payer: Medicare Other | Admitting: General Practice

## 2022-05-18 ENCOUNTER — Encounter: Payer: Self-pay | Admitting: General Practice

## 2022-05-18 VITALS — BP 126/62 | HR 67 | Ht 64.0 in | Wt 143.0 lb

## 2022-05-18 DIAGNOSIS — Z720 Tobacco use: Secondary | ICD-10-CM

## 2022-05-18 DIAGNOSIS — E7800 Pure hypercholesterolemia, unspecified: Secondary | ICD-10-CM | POA: Diagnosis not present

## 2022-05-18 DIAGNOSIS — I10 Essential (primary) hypertension: Secondary | ICD-10-CM

## 2022-05-18 DIAGNOSIS — I2510 Atherosclerotic heart disease of native coronary artery without angina pectoris: Secondary | ICD-10-CM

## 2022-05-18 MED ORDER — ATORVASTATIN CALCIUM 40 MG PO TABS: 40.0000 mg | ORAL_TABLET | Freq: Every day | ORAL | 6 refills | Status: DC

## 2022-05-18 MED ORDER — SODIUM CHLORIDE 0.9% FLUSH: 3.0000 mL | Freq: Two times a day (BID) | INTRAVENOUS | Status: DC

## 2022-05-18 NOTE — Patient Instructions (Signed)
Medication Instructions:  Take pravastatin '80mg'$  daily until you run out then take   ATORVASTATIN '40MG'$  DAILY  *If you need a refill on your cardiac medications before your next appointment, please call your pharmacy*  Follow-Up: Your next appointment:  06-10-2022 at   1:30 pm  In Person with  Almyra Deforest, [address] [place] [address] Dept: [place]: [place]  05/18/2022  You are scheduled for a Cardiac Catheterization on Thursday, June 15 with Dr. Shelva Majestic.  1. Please arrive at the Main Entrance A at [facility]: [place], [place] [address] at 7:00 AM (This time is two hours before your procedure to ensure your preparation). Free valet parking service is available.   Special note: Every effort is made to have your procedure done on time. Please understand that emergencies sometimes delay scheduled procedures.  2. Diet: Do not eat solid foods after midnight.  You may have clear liquids until 5 AM upon the day of the procedure.  3. Labs: You will need to have blood drawn on Tuesday, June 5 at [facility]  Open: [place] (Lunch 12:30 - 1:30)   Phone: [phone]. You do not need to be fasting.  4. Medication instructions in preparation for your procedure:  On the morning of your procedure, take Aspirin and any morning medicines NOT listed above.  You may use sips of water.  5. Plan to go home the same day, you will only stay overnight if medically necessary. 6. You MUST have a responsible adult to drive you home. 7. An adult MUST be with you the first 24 hours after you arrive home. 8. Bring a current list of your medications, and the last time and date medication taken. 9. Bring ID and current insurance cards. 10.Please wear clothes that are easy to get on and off and wear slip-on  shoes.  Thank you for allowing [place] to care for you!   -- [facility]

## 2022-05-19 LAB — BASIC METABOLIC PANEL WITH GFR
BUN/Creatinine Ratio: 16 (ref 10–24)
BUN: 13 mg/dL (ref 8–27)
CO2: 27 mmol/L (ref 20–29)
Calcium: 10.4 mg/dL — ABNORMAL HIGH (ref 8.6–10.2)
Chloride: 91 mmol/L — ABNORMAL LOW (ref 96–106)
Creatinine, Ser: 0.79 mg/dL (ref 0.76–1.27)
Glucose: 80 mg/dL (ref 70–99)
Potassium: 4.7 mmol/L (ref 3.5–5.2)
Sodium: 134 mmol/L (ref 134–144)
eGFR: 100 mL/min/{1.73_m2}

## 2022-05-19 LAB — CBC
Hematocrit: 48.1 % (ref 37.5–51.0)
Hemoglobin: 16.8 g/dL (ref 13.0–17.7)
MCH: 31.9 pg (ref 26.6–33.0)
MCHC: 34.9 g/dL (ref 31.5–35.7)
MCV: 91 fL (ref 79–97)
Platelets: 283 10*3/uL (ref 150–450)
RBC: 5.27 x10E6/uL (ref 4.14–5.80)
RDW: 14.1 % (ref 11.6–15.4)
WBC: 10.4 10*3/uL (ref 3.4–10.8)

## 2022-05-26 ENCOUNTER — Telehealth: Payer: Self-pay | Admitting: *Deleted

## 2022-05-26 NOTE — Telephone Encounter (Signed)
Cardiac Catheterization scheduled at [facility] for: Thursday May 27, 2022 9 AM Arrival time and place: [facility] Main Entrance A at: 7 AM   Nothing to eat after midnight prior to procedure, clear liquids until 5 AM day of procedure.  Medication instructions: -Hold:  Losartan/HCT-AM of procedure  -Except hold medications usual morning medications can be taken with sips of water including aspirin 81 mg.  Confirmed patient has responsible adult to drive home post procedure and be with patient first 24 hours after arriving home.  Patient reports no new symptoms concerning for COVID-19.  Reviewed procedure instructions with patient.

## 2022-05-27 ENCOUNTER — Ambulatory Visit (HOSPITAL_COMMUNITY)
Admission: RE | Disposition: A | Discharge status: Home / Self Care | Payer: Self-pay | Source: Home / Self Care | Attending: Cardiovascular Disease

## 2022-05-27 ENCOUNTER — Other Ambulatory Visit: Payer: Self-pay

## 2022-05-27 ENCOUNTER — Observation Stay (HOSPITAL_COMMUNITY)
Admission: RE | Admit: 2022-05-27 | Discharge: 2022-05-28 | Disposition: A | Discharge status: Home / Self Care | Payer: Medicare Other | Attending: Cardiovascular Disease | Admitting: Cardiovascular Disease

## 2022-05-27 DIAGNOSIS — E785 Hyperlipidemia, unspecified: Secondary | ICD-10-CM | POA: Insufficient documentation

## 2022-05-27 DIAGNOSIS — I2584 Coronary atherosclerosis due to calcified coronary lesion: Secondary | ICD-10-CM | POA: Diagnosis not present

## 2022-05-27 DIAGNOSIS — Z7982 Long term (current) use of aspirin: Secondary | ICD-10-CM | POA: Insufficient documentation

## 2022-05-27 DIAGNOSIS — I25110 Atherosclerotic heart disease of native coronary artery with unstable angina pectoris: Principal | ICD-10-CM | POA: Insufficient documentation

## 2022-05-27 DIAGNOSIS — F17290 Nicotine dependence, other tobacco product, uncomplicated: Secondary | ICD-10-CM | POA: Insufficient documentation

## 2022-05-27 DIAGNOSIS — K219 Gastro-esophageal reflux disease without esophagitis: Secondary | ICD-10-CM | POA: Diagnosis not present

## 2022-05-27 DIAGNOSIS — Z79899 Other long term (current) drug therapy: Secondary | ICD-10-CM | POA: Insufficient documentation

## 2022-05-27 DIAGNOSIS — Z95828 Presence of other vascular implants and grafts: Secondary | ICD-10-CM | POA: Diagnosis not present

## 2022-05-27 DIAGNOSIS — I10 Essential (primary) hypertension: Secondary | ICD-10-CM | POA: Insufficient documentation

## 2022-05-27 DIAGNOSIS — I2510 Atherosclerotic heart disease of native coronary artery without angina pectoris: Secondary | ICD-10-CM | POA: Diagnosis present

## 2022-05-27 DIAGNOSIS — R9389 Abnormal findings on diagnostic imaging of other specified body structures: Secondary | ICD-10-CM | POA: Insufficient documentation

## 2022-05-27 DIAGNOSIS — I209 Angina pectoris, unspecified: Secondary | ICD-10-CM | POA: Diagnosis present

## 2022-05-27 DIAGNOSIS — R918 Other nonspecific abnormal finding of lung field: Secondary | ICD-10-CM | POA: Insufficient documentation

## 2022-05-27 DIAGNOSIS — I200 Unstable angina: Secondary | ICD-10-CM | POA: Diagnosis present

## 2022-05-27 HISTORY — PX: CORONARY BALLOON ANGIOPLASTY: CATH118233

## 2022-05-27 HISTORY — PX: INTRAVASCULAR LITHOTRIPSY: CATH118324

## 2022-05-27 HISTORY — PX: LEFT HEART CATH AND CORONARY ANGIOGRAPHY: CATH118249

## 2022-05-27 LAB — POCT ACTIVATED CLOTTING TIME
Activated Clotting Time: 510 s
Activated Clotting Time: 528 s
Activated Clotting Time: 275 seconds
Activated Clotting Time: 389 s
Activated Clotting Time: 299 s
Activated Clotting Time: 853 s
Activated Clotting Time: 221 s
Activated Clotting Time: 167 seconds

## 2022-05-27 SURGERY — LEFT HEART CATH AND CORONARY ANGIOGRAPHY
Anesthesia: LOCAL

## 2022-05-27 MED ORDER — LABETALOL HCL 5 MG/ML IV SOLN: 10.0000 mg | INTRAVENOUS | Status: AC | PRN

## 2022-05-27 MED ORDER — DIAZEPAM 5 MG PO TABS: 5.0000 mg | ORAL_TABLET | Freq: Four times a day (QID) | ORAL | Status: DC | PRN

## 2022-05-27 MED ORDER — MIDAZOLAM HCL 2 MG/2ML IJ SOLN
INTRAMUSCULAR | Status: DC | PRN
  Administered 2022-05-27 (×2): 1 mg via INTRAVENOUS
  Administered 2022-05-27: 2 mg via INTRAVENOUS

## 2022-05-27 MED ORDER — TICAGRELOR 90 MG PO TABS
ORAL_TABLET | ORAL | Status: DC | PRN
  Administered 2022-05-27: 180 mg via ORAL

## 2022-05-27 MED ORDER — METOPROLOL TARTRATE 25 MG PO TABS
25.0000 mg | ORAL_TABLET | Freq: Two times a day (BID) | ORAL | Status: DC
  Administered 2022-05-27 – 2022-05-28 (×2): 25 mg via ORAL
  Filled 2022-05-27 (×2): qty 1

## 2022-05-27 MED ORDER — ASPIRIN 81 MG PO CHEW
81.0000 mg | CHEWABLE_TABLET | Freq: Every day | ORAL | Status: DC
  Administered 2022-05-28: 81 mg via ORAL
  Filled 2022-05-27 (×2): qty 1

## 2022-05-27 MED ORDER — HEPARIN SODIUM (PORCINE) 1000 UNIT/ML IJ SOLN
INTRAMUSCULAR | Status: DC | PRN
  Administered 2022-05-27: 7000 [IU] via INTRAVENOUS
  Administered 2022-05-27: 3000 [IU] via INTRAVENOUS

## 2022-05-27 MED ORDER — ASPIRIN 81 MG PO CHEW: 81.0000 mg | CHEWABLE_TABLET | ORAL | Status: DC

## 2022-05-27 MED ORDER — ACETAMINOPHEN 325 MG PO TABS
650.0000 mg | ORAL_TABLET | ORAL | Status: DC | PRN
  Administered 2022-05-27: 650 mg via ORAL
  Filled 2022-05-27: qty 2

## 2022-05-27 MED ORDER — FENTANYL CITRATE (PF) 100 MCG/2ML IJ SOLN
INTRAMUSCULAR | Status: AC
  Filled 2022-05-27: qty 2

## 2022-05-27 MED ORDER — HEPARIN (PORCINE) IN NACL 1000-0.9 UT/500ML-% IV SOLN
INTRAVENOUS | Status: AC
Start: 2022-05-27 — End: ?
  Filled 2022-05-27: qty 500

## 2022-05-27 MED ORDER — HEPARIN (PORCINE) IN NACL 1000-0.9 UT/500ML-% IV SOLN
INTRAVENOUS | Status: AC
  Filled 2022-05-27: qty 500

## 2022-05-27 MED ORDER — HEPARIN (PORCINE) IN NACL 1000-0.9 UT/500ML-% IV SOLN
INTRAVENOUS | Status: DC | PRN
  Administered 2022-05-27 (×3): 500 mL

## 2022-05-27 MED ORDER — VERAPAMIL HCL 2.5 MG/ML IV SOLN
INTRAVENOUS | Status: AC
  Filled 2022-05-27: qty 2

## 2022-05-27 MED ORDER — SODIUM CHLORIDE 0.9 % IV SOLN: 250.0000 mL | INTRAVENOUS | Status: DC | PRN

## 2022-05-27 MED ORDER — ONDANSETRON HCL 4 MG/2ML IJ SOLN
INTRAMUSCULAR | Status: AC
  Filled 2022-05-27: qty 2

## 2022-05-27 MED ORDER — TICAGRELOR 90 MG PO TABS
90.0000 mg | ORAL_TABLET | Freq: Two times a day (BID) | ORAL | Status: DC
Start: 2022-05-27 — End: 2022-05-28
  Administered 2022-05-27 – 2022-05-28 (×2): 90 mg via ORAL
  Filled 2022-05-27 (×2): qty 1

## 2022-05-27 MED ORDER — SODIUM CHLORIDE 0.9 % IV SOLN
INTRAVENOUS | Status: DC
  Administered 2022-05-27: 125 mL/h via INTRAVENOUS

## 2022-05-27 MED ORDER — HEPARIN SODIUM (PORCINE) 1000 UNIT/ML IJ SOLN
INTRAMUSCULAR | Status: AC
  Filled 2022-05-27: qty 10

## 2022-05-27 MED ORDER — TICAGRELOR 90 MG PO TABS
ORAL_TABLET | ORAL | Status: AC
  Filled 2022-05-27: qty 2

## 2022-05-27 MED ORDER — SODIUM CHLORIDE 0.9 % WEIGHT BASED INFUSION: 1.0000 mL/kg/h | INTRAVENOUS | Status: DC

## 2022-05-27 MED ORDER — HYDRALAZINE HCL 20 MG/ML IJ SOLN: 10.0000 mg | INTRAMUSCULAR | Status: AC | PRN

## 2022-05-27 MED ORDER — FENTANYL CITRATE (PF) 100 MCG/2ML IJ SOLN
INTRAMUSCULAR | Status: DC | PRN
  Administered 2022-05-27 (×4): 25 ug via INTRAVENOUS

## 2022-05-27 MED ORDER — ATORVASTATIN CALCIUM 80 MG PO TABS
80.0000 mg | ORAL_TABLET | Freq: Every day | ORAL | Status: DC
  Administered 2022-05-28: 80 mg via ORAL
  Filled 2022-05-27 (×2): qty 1

## 2022-05-27 MED ORDER — SODIUM CHLORIDE 0.9% FLUSH
3.0000 mL | Freq: Two times a day (BID) | INTRAVENOUS | Status: DC
  Administered 2022-05-27 – 2022-05-28 (×2): 3 mL via INTRAVENOUS

## 2022-05-27 MED ORDER — NITROGLYCERIN 1 MG/10 ML FOR IR/CATH LAB
INTRA_ARTERIAL | Status: DC | PRN
  Administered 2022-05-27 (×2): 200 ug via INTRACORONARY

## 2022-05-27 MED ORDER — SODIUM CHLORIDE 0.9% FLUSH: 3.0000 mL | INTRAVENOUS | Status: DC | PRN

## 2022-05-27 MED ORDER — IOHEXOL 350 MG/ML SOLN
INTRAVENOUS | Status: DC | PRN
  Administered 2022-05-27: 250 mL

## 2022-05-27 MED ORDER — AMLODIPINE BESYLATE 10 MG PO TABS
10.0000 mg | ORAL_TABLET | Freq: Every day | ORAL | Status: DC
  Administered 2022-05-28: 10 mg via ORAL
  Filled 2022-05-27 (×2): qty 1

## 2022-05-27 MED ORDER — NITROGLYCERIN 1 MG/10 ML FOR IR/CATH LAB
INTRA_ARTERIAL | Status: AC
Start: 2022-05-27 — End: ?
  Filled 2022-05-27: qty 10

## 2022-05-27 MED ORDER — MIDAZOLAM HCL 2 MG/2ML IJ SOLN
INTRAMUSCULAR | Status: AC
  Filled 2022-05-27: qty 2

## 2022-05-27 MED ORDER — LIDOCAINE HCL (PF) 1 % IJ SOLN
INTRAMUSCULAR | Status: DC | PRN
  Administered 2022-05-27: 20 mL via INTRADERMAL

## 2022-05-27 MED ORDER — LIDOCAINE HCL (PF) 1 % IJ SOLN
INTRAMUSCULAR | Status: AC
  Filled 2022-05-27: qty 30

## 2022-05-27 MED ORDER — ONDANSETRON HCL 4 MG/2ML IJ SOLN
4.0000 mg | Freq: Four times a day (QID) | INTRAMUSCULAR | Status: DC | PRN
  Administered 2022-05-27: 4 mg via INTRAVENOUS

## 2022-05-27 MED ORDER — SODIUM CHLORIDE 0.9 % WEIGHT BASED INFUSION
3.0000 mL/kg/h | INTRAVENOUS | Status: DC
  Administered 2022-05-27: 3 mL/kg/h via INTRAVENOUS

## 2022-05-27 SURGICAL SUPPLY — 32 items
BALLN SAPPHIRE 1.5X12 (BALLOONS) ×3
BALLN SAPPHIRE 2.0X12 (BALLOONS) ×3
BALLN SAPPHIRE 2.5X12 (BALLOONS) ×3
BALLN ~~LOC~~ EUPHORA RX 2.75X20 (BALLOONS) ×3
BALLOON SAPPHIRE 1.5X12 (BALLOONS) IMPLANT
BALLOON SAPPHIRE 2.0X12 (BALLOONS) IMPLANT
BALLOON SAPPHIRE 2.5X12 (BALLOONS) IMPLANT
BALLOON ~~LOC~~ EUPHORA RX 2.75X20 (BALLOONS) IMPLANT
CATH INFINITI 5FR MULTPACK ANG (CATHETERS) ×1 IMPLANT
CATH LAUNCHER 6FR JR4 (CATHETERS) ×1 IMPLANT
CATH SHOCKWAVE 2.5X12 (CATHETERS) IMPLANT
CATH TELESCOPE 6F GEC (CATHETERS) ×1 IMPLANT
CATH VISTA GUIDE 6FR AL1 (CATHETERS) ×1 IMPLANT
CATH VISTA GUIDE 6FR XBRCA (CATHETERS) ×1 IMPLANT
CATHETER SHOCKWAVE 2.5X12 (CATHETERS) ×3
COVER SWIFTLINK CONNECTOR (BAG) ×1 IMPLANT
ELECT DEFIB PAD ADLT CADENCE (PAD) ×1 IMPLANT
GLIDESHEATH SLEND SS 6F .021 (SHEATH) ×1 IMPLANT
GUIDEWIRE INQWIRE 1.5J.035X260 (WIRE) IMPLANT
INQWIRE 1.5J .035X260CM (WIRE) ×3
KIT ENCORE 26 ADVANTAGE (KITS) ×1 IMPLANT
KIT HEART LEFT (KITS) ×3 IMPLANT
PACK CARDIAC CATHETERIZATION (CUSTOM PROCEDURE TRAY) ×3 IMPLANT
SHEATH PINNACLE 5F 10CM (SHEATH) ×1 IMPLANT
SHEATH PINNACLE 6F 10CM (SHEATH) ×1 IMPLANT
SHEATH PROBE COVER 6X72 (BAG) ×1 IMPLANT
TRANSDUCER W/STOPCOCK (MISCELLANEOUS) ×3 IMPLANT
TUBING CIL FLEX 10 FLL-RA (TUBING) ×3 IMPLANT
WIRE ASAHI PROWATER 180CM (WIRE) ×1 IMPLANT
WIRE COUGAR XT STRL 190CM (WIRE) ×1 IMPLANT
WIRE EMERALD 3MM-J .035X150CM (WIRE) ×1 IMPLANT
WIRE PT2 MS 185 (WIRE) ×1 IMPLANT

## 2022-05-27 NOTE — Progress Notes (Signed)
Right groin 75f arterial sheath pulled at 1418. Hematoma noted level 2 prior to pulling sheath.  Pressure held for 30 minutes. Post site level 0.  2+ DP bilateral.  Right groin dressed with gauze and tegaderm. Bedrest started at 1445 x 4 hours.  No c/o pain at present.

## 2022-05-27 NOTE — Progress Notes (Signed)
Prior to pulling sheath hematoma noted level 2.  ACT 167, Sheath pulled at 1418 and pressure applied.  Pt vagal, BP 51/35.  Bolus of fluid NSS administered and pt put in trendelenburg.  Zofran administered for nausea.

## 2022-05-27 NOTE — Plan of Care (Signed)
  Problem: Cardiovascular: Goal: Ability to achieve and maintain adequate cardiovascular perfusion will improve Outcome: Progressing Goal: Vascular access site(s) Level 0-1 will be maintained Outcome: Progressing   

## 2022-05-27 NOTE — Interval H&P Note (Signed)
Cath Lab Visit (complete for each Cath Lab visit)  Clinical Evaluation Leading to the Procedure:   ACS: No.  Non-ACS:    Anginal Classification: CCS II  Anti-ischemic medical therapy: Maximal Therapy (2 or more classes of medications)  Non-Invasive Test Results: Intermediate-risk stress test findings: cardiac mortality 1-3%/year  Prior CABG: No previous CABG      History and Physical Interval Note:  05/27/2022 8:59 AM  Gerald Rogers  has presented today for surgery, with the diagnosis of cad.  The various methods of treatment have been discussed with the patient and family. After consideration of risks, benefits and other options for treatment, the patient has consented to  Procedure(s): LEFT HEART CATH AND CORONARY ANGIOGRAPHY (N/A) as a surgical intervention.  The patient's history has been reviewed, patient examined, no change in status, stable for surgery.  I have reviewed the patient's chart and labs.  Questions were answered to the patient's satisfaction.     Shelva Majestic

## 2022-05-28 ENCOUNTER — Other Ambulatory Visit (HOSPITAL_COMMUNITY): Payer: Self-pay

## 2022-05-28 ENCOUNTER — Encounter (HOSPITAL_COMMUNITY): Payer: Self-pay | Admitting: Cardiovascular Disease

## 2022-05-28 DIAGNOSIS — Z95828 Presence of other vascular implants and grafts: Secondary | ICD-10-CM | POA: Diagnosis not present

## 2022-05-28 DIAGNOSIS — I2510 Atherosclerotic heart disease of native coronary artery without angina pectoris: Secondary | ICD-10-CM | POA: Diagnosis not present

## 2022-05-28 DIAGNOSIS — R9389 Abnormal findings on diagnostic imaging of other specified body structures: Secondary | ICD-10-CM | POA: Diagnosis not present

## 2022-05-28 DIAGNOSIS — I2584 Coronary atherosclerosis due to calcified coronary lesion: Secondary | ICD-10-CM | POA: Diagnosis not present

## 2022-05-28 DIAGNOSIS — K219 Gastro-esophageal reflux disease without esophagitis: Secondary | ICD-10-CM | POA: Diagnosis not present

## 2022-05-28 DIAGNOSIS — R918 Other nonspecific abnormal finding of lung field: Secondary | ICD-10-CM | POA: Diagnosis not present

## 2022-05-28 DIAGNOSIS — I25110 Atherosclerotic heart disease of native coronary artery with unstable angina pectoris: Secondary | ICD-10-CM | POA: Diagnosis not present

## 2022-05-28 DIAGNOSIS — F17290 Nicotine dependence, other tobacco product, uncomplicated: Secondary | ICD-10-CM | POA: Diagnosis not present

## 2022-05-28 DIAGNOSIS — E785 Hyperlipidemia, unspecified: Secondary | ICD-10-CM | POA: Diagnosis not present

## 2022-05-28 DIAGNOSIS — I200 Unstable angina: Secondary | ICD-10-CM | POA: Diagnosis present

## 2022-05-28 DIAGNOSIS — Z79899 Other long term (current) drug therapy: Secondary | ICD-10-CM | POA: Diagnosis not present

## 2022-05-28 DIAGNOSIS — I10 Essential (primary) hypertension: Secondary | ICD-10-CM | POA: Diagnosis not present

## 2022-05-28 DIAGNOSIS — Z7982 Long term (current) use of aspirin: Secondary | ICD-10-CM | POA: Diagnosis not present

## 2022-05-28 LAB — CBC
HCT: 38.7 % — ABNORMAL LOW (ref 39.0–52.0)
Hemoglobin: 13.8 g/dL (ref 13.0–17.0)
MCH: 31.9 pg (ref 26.0–34.0)
MCHC: 35.7 g/dL (ref 30.0–36.0)
MCV: 89.6 fL (ref 80.0–100.0)
Platelets: 254 10*3/uL (ref 150–400)
RBC: 4.32 MIL/uL (ref 4.22–5.81)
RDW: 14.5 % (ref 11.5–15.5)
WBC: 10.3 10*3/uL (ref 4.0–10.5)
nRBC: 0 % (ref 0.0–0.2)

## 2022-05-28 LAB — BASIC METABOLIC PANEL
Anion gap: 7 (ref 5–15)
BUN: 10 mg/dL (ref 8–23)
CO2: 20 mmol/L — ABNORMAL LOW (ref 22–32)
Calcium: 8.6 mg/dL — ABNORMAL LOW (ref 8.9–10.3)
Chloride: 109 mmol/L (ref 98–111)
Creatinine, Ser: 0.74 mg/dL (ref 0.61–1.24)
GFR, Estimated: >60 mL/min (ref >60)
Glucose, Bld: 90 mg/dL (ref 70–99)
Potassium: 4 mmol/L (ref 3.5–5.1)
Sodium: 136 mmol/L (ref 135–145)

## 2022-05-28 MED ORDER — TICAGRELOR 90 MG PO TABS
90.0000 mg | ORAL_TABLET | Freq: Two times a day (BID) | ORAL | 3 refills | Status: DC
  Filled 2022-05-28: qty 60, 30d supply, fill #0

## 2022-05-28 MED ORDER — METOPROLOL TARTRATE 25 MG PO TABS
25.0000 mg | ORAL_TABLET | Freq: Two times a day (BID) | ORAL | 6 refills | Status: DC
  Filled 2022-05-28: qty 60, 30d supply, fill #0

## 2022-05-28 MED ORDER — NITROGLYCERIN 0.4 MG SL SUBL
0.4000 mg | SUBLINGUAL_TABLET | SUBLINGUAL | 12 refills | Status: DC | PRN
  Filled 2022-05-28: qty 25, 30d supply, fill #0

## 2022-05-28 MED ORDER — ATORVASTATIN CALCIUM 80 MG PO TABS
80.0000 mg | ORAL_TABLET | Freq: Every day | ORAL | 6 refills | Status: DC
  Filled 2022-05-28: qty 30, 30d supply, fill #0

## 2022-05-28 MED ORDER — ASPIRIN 81 MG PO TBEC
81.0000 mg | DELAYED_RELEASE_TABLET | Freq: Every day | ORAL | 3 refills | Status: AC
  Filled 2022-05-28: qty 90, 90d supply, fill #0

## 2022-05-28 MED FILL — Verapamil HCl IV Soln 2.5 MG/ML: INTRAVENOUS | Qty: 2 | Status: AC

## 2022-05-28 NOTE — Discharge Summary (Signed)
Discharge Summary    Patient ID: Gerald Rogers MRN: [id number]; DOB: 1961-03-02  Admit date: 05/27/2022 Discharge date: 05/28/2022  PCP:  Hali Marry, MD   [facility] Providers Cardiologist:  Kirk Ruths, MD   {  Discharge Diagnoses    Principal Problem:   CAD in native artery Active Problems:   Coronary artery disease involving native heart without angina pectoris   Unstable angina (HCC)   HTN   HLD   Tobacco abuse  Diagnostic Studies/Procedures    INTRAVASCULAR LITHOTRIPSY  CORONARY BALLOON ANGIOPLASTY  LEFT HEART CATH AND CORONARY ANGIOGRAPHY   Conclusion      Prox RCA lesion is 99% stenosed.   Mid RCA lesion is 80% stenosed.   Balloon angioplasty was performed.   Post intervention, there is a 90% residual stenosis.   Post intervention, there is a 70% residual stenosis.   The left ventricular systolic function is normal.   LV end diastolic pressure is normal.   The left ventricular ejection fraction is 50-55% by visual estimate.   Severely calcified dominant RCA with 95 to 99% proximal to mid stenosis prior to the RV marginal branch followed by 80% mid stenosis.   Relatively normal left main, LAD and left circumflex vessels.   Preserved global LV contractility with mild subtle mid inferior hypocontractility.  EF estimate approximately 50 to 55%.  LVEDP 9 mmHg.   Very difficult percutaneous coronary intervention to catheter backup difficulties with right catheter extension with a telescope catheter and PTCA utilizing 1.5, 2.0 and 2.5 x 12 mm compliant balloons and 5 runs of shockwave the proximal calcified lesion.  The procedure ultimately had to be aborted due to excess of fluoroscopy time.  There appeared to be a stable small dissection calcified segment since the patient had stable hemodynamics and was chest pain-free without ECG changes plan is for future evaluation allowing time for stability of lesion.   RECOMMENDATION: Continue DAPT at  least until after his next catheterization and probably long-term with potential subsequent stenting.  Hydrate post procedure.  Patient was made aware of fluoroscopy time excess.  Increase medical therapy for anti-ischemic benefit.   Diagnostic Dominance: Right  Intervention     History of Present Illness     Gerald Rogers is a 61 y.o. male with PMH of essential hypertension, AV fistula, GERD, tremor, hyperlipidemia, dysphonia, tobacco abuse and abnormal coronary CTA presented for cath.   He was noted to have chest discomfort and was referred by his PCP.  He was seen by Dr. Stanford Breed on 04/21/2022.  During that time he reported several weeks of occasional episodes of chest discomfort.  He noted this with decreasing his aspirin dose from 325 to 81 mg.  He noted pain in his upper chest and across the shoulders as well as his left upper extremity.  The discomfort would present during exertion and would last for 30 minutes to 1 hour. A coronary CTA was ordered which showed severe coronary disease in his mid RCA. He states after increasing his aspirin to 325 he has not noticed any chest discomfort. Cardiac cath was arranged.   Hospital Course     Consultants: None   Cardiac catheterization performed showed 99% proximal right coronary artery followed by 80% lesion with normal LV function.  Attempt at PCI was unsuccessful and was aborted due to excess fluoroscopy time and possible small dissection.  Case discussed between Dr. Claiborne Billings and Dr. Stanford Breed.  Plan to treat medically with aspirin, Brilinta, statin and  antianginal.   Admitted overnight without any problem.  Ambulated well.  Cardiac rehab 2 as outpatient.  Renal function stable.  Plan to follow-up in the clinic in 1 week followed by reattempt PCI.   The patient been seen by Dr. Stanford Breed today and deemed ready for discharge home. All follow-up appointments have been scheduled. Discharge medications are listed below.    Did the patient have an  acute coronary syndrome (MI, NSTEMI, STEMI, etc) this admission?:  No                               Did the patient have a percutaneous coronary intervention (stent / angioplasty)?:  No.    Discharge Vitals Blood pressure 131/89, pulse 70, temperature 98.8 F (37.1 C), temperature source Oral, resp. rate 12, height '5\' 4"'$  (1.626 m), weight 64.9 kg, SpO2 97 %.  Filed Weights   05/27/22 0705  Weight: 64.9 kg    Labs & Radiologic Studies    CBC Recent Labs    05/28/22 0242  WBC 10.3  HGB 13.8  HCT 38.7*  MCV 89.6  PLT 709   Basic Metabolic Panel Recent Labs    05/28/22 0242  NA 136  K 4.0  CL 109  CO2 20*  GLUCOSE 90  BUN 10  CREATININE 0.74  CALCIUM 8.6*   _____________  CARDIAC CATHETERIZATION  Result Date: 05/27/2022   Prox RCA lesion is 99% stenosed.   Mid RCA lesion is 80% stenosed.   Balloon angioplasty was performed.   Post intervention, there is a 90% residual stenosis.   Post intervention, there is a 70% residual stenosis.   The left ventricular systolic function is normal.   LV end diastolic pressure is normal.   The left ventricular ejection fraction is 50-55% by visual estimate. Severely calcified dominant RCA with 95 to 99% proximal to mid stenosis prior to the RV marginal branch followed by 80% mid stenosis. Relatively normal left main, LAD and left circumflex vessels. Preserved global LV contractility with mild subtle mid inferior hypocontractility.  EF estimate approximately 50 to 55%.  LVEDP 9 mmHg. Very difficult percutaneous coronary intervention to catheter backup difficulties with right catheter extension with a telescope catheter and PTCA utilizing 1.5, 2.0 and 2.5 x 12 mm compliant balloons and 5 runs of shockwave the proximal calcified lesion.  The procedure ultimately had to be aborted due to excess of fluoroscopy time.  There appeared to be a stable small dissection calcified segment since the patient had stable hemodynamics and was chest pain-free without  ECG changes plan is for future evaluation allowing time for stability of lesion. RECOMMENDATION: Continue DAPT at least until after his next catheterization and probably long-term with potential subsequent stenting.  Hydrate post procedure.  Patient was made aware of fluoroscopy time excess.  Increase medical therapy for anti-ischemic benefit.  PERIPHERAL VASCULAR CATHETERIZATION  Result Date: 05/27/2022   Prox RCA lesion is 99% stenosed.   Mid RCA lesion is 80% stenosed.   Balloon angioplasty was performed.   Post intervention, there is a 90% residual stenosis.   Post intervention, there is a 70% residual stenosis.   The left ventricular systolic function is normal.   LV end diastolic pressure is normal.   The left ventricular ejection fraction is 50-55% by visual estimate. Severely calcified dominant RCA with 95 to 99% proximal to mid stenosis prior to the RV marginal branch followed by 80% mid stenosis. Relatively normal  left main, LAD and left circumflex vessels. Preserved global LV contractility with mild subtle mid inferior hypocontractility.  EF estimate approximately 50 to 55%.  LVEDP 9 mmHg. Very difficult percutaneous coronary intervention to catheter backup difficulties with right catheter extension with a telescope catheter and PTCA utilizing 1.5, 2.0 and 2.5 x 12 mm compliant balloons and 5 runs of shockwave the proximal calcified lesion.  The procedure ultimately had to be aborted due to excess of fluoroscopy time.  There appeared to be a stable small dissection calcified segment since the patient had stable hemodynamics and was chest pain-free without ECG changes plan is for future evaluation allowing time for stability of lesion. RECOMMENDATION: Continue DAPT at least until after his next catheterization and probably long-term with potential subsequent stenting.  Hydrate post procedure.  Patient was made aware of fluoroscopy time excess.  Increase medical therapy for anti-ischemic benefit.  CT  CORONARY FRACTIONAL FLOW RESERVE DATA PREP  Result Date: 05/12/2022 EXAM: CT FFR ANALYSIS CLINICAL DATA:  abnormal cardiac CT FINDINGS: FFRct analysis was performed on the original cardiac CT angiogram dataset. Diagrammatic representation of the FFRct analysis is provided in a separate PDF document in PACS. This dictation was created using the PDF document and an interactive 3D model of the results. 3D model is not available in the EMR/PACS. Normal FFR range is >0.80. Indeterminate (grey) zone is 0.76-0.80. FFR delta of 0.13 is considered significant. 1. Left Main: FFR = 0.97 2. LAD: Proximal FFR = 0.94, Mid FFR = 0.90, Distal FFR = 0.86, D1 FFR = 0.95, D2 FFR = not mapped 3. LCX: Proximal FFR = 0.96, distal FFR = not mapped, OM2 FFR = 0.90 4. RCA: Proximal FFR = 0.98, mid FFR =0.58, Distal FFR = 0.58 IMPRESSION: 1.  CT FFR analysis showed significant stenosis in the mid RCA. RECOMMENDATIONS: Recommend cardiac catheterization if clinically indicated Guideline-directed medical therapy and aggressive risk factor modification for secondary prevention of coronary artery disease. Electronically Signed   By: Cherlynn Kaiser M.D.   On: 05/12/2022 17:34   CT CORONARY MORPH W/CTA COR W/SCORE W/CA W/CM &/OR WO/CM  Addendum Date: 05/12/2022   ADDENDUM REPORT: 05/12/2022 17:27 HISTORY: Chest pain EXAM: Cardiac/Coronary  CT TECHNIQUE: The patient was scanned on a [employer]. PROTOCOL: A 120 kV prospective scan was triggered in the descending thoracic aorta at 111 HU's. Axial non-contrast 3 mm slices were carried out through the heart. The data set was analyzed on a dedicated work station and scored using the Agatston method. Gantry rotation speed was 250 msecs and collimation was .6 mm. Beta blockade and 0.8 mg of sl NTG was given. The 3D data set was reconstructed in 5% intervals of the 35-75 % of the R-R cycle. Systolic and diastolic phases were analyzed on a dedicated work station using MPR, MIP and VRT  modes. The patient received 136m OMNIPAQUE IOHEXOL 350 MG/ML SOLN contrast. FINDINGS: Image quality: Good Noise artifact is: Limited Coronary calcium score is 1015, which places the patient in the 95th percentile for age and sex matched control. Coronary arteries: Normal coronary origins.  Right dominance. Right Coronary Artery: Mild mixed atherosclerotic plaque in the proximal and distal RCA, 25-49% stenosis. Severe mixed atherosclerotic plaque in the mid RCA with calcium and low attenuation plaque suggesting high risk plaque characteristics, 70-99% stenosis. Patent PDA and PLA without detectable plaque or stenosis Left Main Coronary Artery: No detectable plaque or stenosis. Left Anterior Descending Coronary Artery: Mild atherosclerotic plaque in the proximal LAD, 25-49% stenosis.  At the level of the bifurcation of the first diagonal artery there is a mixed atherosclerotic plaque with mild vessel stenosis 25-49%, however with positive remodeling and spotty calcifications, suggesting high risk plaque characteristics. At the level of the second diagonal there is a plaque with similar characteristics with minimal vessel stenosis, <25%. First diagonal branch has no significant plaque or stenosis. Severe mixed atherosclerotic plaque in the proximal second diagonal artery, 70-99% stenosis. Left Circumflex Artery: Mild mixed atherosclerotic plaque in the proximal LCx and scattered in OM2, 25-49% stenosis. Aorta: Normal size, 37 mm at the mid ascending aorta (level of the PA bifurcation) measured double oblique. Severe aortic atherosclerosis. Sinus of valsalva: 33 x 34 x 35 mm Aortic Valve: No calcifications. Other findings: Normal variant pulmonary vein drainage into the left atrium. Normal left atrial appendage without thrombus. Normal size of the pulmonary artery. IMPRESSION: 1. Severe CAD in mid RCA, CADRADS = 4V. CT FFR will be performed and reported separately. 2. Vulnerable plaque characteristics in the proximal  LAD with mild vessel stenosis. 3. Coronary calcium score is 1015, which places the patient in the 95th percentile for age and sex matched control. 4. Normal coronary origins with right dominance. 5.  Patent foramen ovale with left to right shunt. 6.  Aortic atherosclerosis. Electronically Signed   By: Cherlynn Kaiser M.D.   On: 05/12/2022 17:27   Result Date: 05/12/2022 EXAM: OVER-READ INTERPRETATION  CT CHEST The following report is an over-read performed by radiologist Dr. Dorathy Kinsman [facility] on 05/12/2022. This over-read does not include interpretation of cardiac or coronary anatomy or pathology. The coronary calcium score/coronary CTA interpretation by the cardiologist is attached. COMPARISON:  Chest two views 11/18/2011 FINDINGS: Cardiovascular: A small portion of the pulmonary arteries is imaged without a definite large central pulmonary embolism visualized. The ascending aorta measures up to 3.4 cm in caliber, nonaneurysmal. Mediastinum/Nodes: There are no enlarged lymph nodes within the visualized mediastinum. Lungs/Pleura: Mild curvilinear subsegmental atelectasis versus scarring within the middle lobe lingula, and medial left lower lobe. No pleural effusion or pneumothorax. Upper abdomen: Small sliding hiatal hernia. Musculoskeletal/Chest wall: No chest wall mass or suspicious osseous findings within the visualized chest. IMPRESSION: Small sliding hiatal hernia. Otherwise, no significant extracardiac findings within the visualized chest. Electronically Signed: By: Yvonne Kendall M.D. On: 05/12/2022 13:43    Disposition   Pt is being discharged home today in good condition.  Follow-up Plans & Appointments     Follow-up Information     Lenna Sciara, NP Follow up on 06/04/2022.   Specialties: Nurse Practitioner, Family Medicine Why: '@10'$ :05am for cath follow up Contact information: [address] [place] [address] [phone]                 Discharge Instructions     Diet - low sodium heart healthy   Complete by: As directed    Discharge instructions   Complete by: As directed    No driving for 48 hours. No lifting over 5 lbs for 1 week. No sexual activity for 1 week. Do not return to work until further PCI. Keep procedure site clean & dry. If you notice increased pain, swelling, bleeding or pus, call/return!  You may shower, but no soaking baths/hot tubs/pools for 1 week.   Increase activity slowly   Complete by: As directed        Discharge Medications   Allergies as of 05/28/2022       Reactions   Cymbalta [  duloxetine Hcl] Other (See Comments)   Generalized weakness and mental status changes   Klonopin [clonazepam] Other (See Comments)   Mental status changes        Medication List     STOP taking these medications    acetaminophen 500 MG tablet Commonly known as: TYLENOL   aspirin 325 MG tablet Replaced by: aspirin EC 81 MG tablet   pravastatin 40 MG tablet Commonly known as: PRAVACHOL   pravastatin 80 MG tablet Commonly known as: PRAVACHOL       TAKE these medications    amLODipine 10 MG tablet Commonly known as: NORVASC Take 1 tablet (10 mg total) by mouth daily.   aspirin EC 81 MG tablet Take 1 tablet (81 mg total) by mouth daily. Swallow whole. Replaces: aspirin 325 MG tablet   atorvastatin 80 MG tablet Commonly known as: LIPITOR Take 1 tablet (80 mg total) by mouth daily. What changed:  medication strength how much to take   calcium carbonate 500 MG chewable tablet Commonly known as: TUMS - dosed in mg elemental calcium Chew 500 mg by mouth at bedtime as needed for indigestion or heartburn.   losartan-hydrochlorothiazide 100-12.5 MG tablet Commonly known as: HYZAAR Take 1 tablet by mouth daily.   metoprolol tartrate 25 MG tablet Commonly known as: LOPRESSOR Take 1 tablet (25 mg total) by mouth 2 (two) times daily. What changed:  medication strength how much to  take how to take this when to take this additional instructions   neomycin-bacitracin-polymyxin [phone] ointment Apply 1 application. topically as needed (wound care).   nitroGLYCERIN 0.4 MG SL tablet Commonly known as: Nitrostat Place 1 tablet (0.4 mg total) under the tongue every 5 (five) minutes as needed.   ticagrelor 90 MG Tabs tablet Commonly known as: BRILINTA Take 1 tablet (90 mg total) by mouth 2 (two) times daily.        Outstanding Labs/Studies   None  Duration of Discharge Encounter   Greater than 30 minutes including physician time.  Jarrett Soho, PA 05/28/2022, 11:18 AM

## 2022-05-28 NOTE — Progress Notes (Signed)
CARDIAC REHAB PHASE I   Offered to walk with pt. Pt states ambulating without difficulty. Home rollator at bedside. Pt educated on importance of ASA and Brilinta. Reviewed site care and restrictions. Encouraged ambulation with emphasis on listening to symptoms. Pt for staged procedure in a couple of weeks, will refer to CRP II at that time.  [id number] Rufina Falco, RN BSN 05/28/2022 10:35 AM

## 2022-05-28 NOTE — TOC Benefit Eligibility Note (Signed)
Patient Teacher, English as a foreign language completed.    The patient does not have Medicare Part D Coverage only has Medicare Part A & B.  Lyndel Safe, [place] Patient Advocate Specialist [place] Patient Advocate Team Direct Number: [phone]  Fax: [phone]

## 2022-05-28 NOTE — Progress Notes (Signed)
Progress Note  Patient Name: Gerald Rogers Date of Encounter: 05/28/2022  [facility] Cardiologist: Kirk Ruths, MD   Subjective   No CP; mild dyspnea likely secondary to Brilinta  Inpatient Medications    Scheduled Meds:  amLODipine  10 mg Oral Daily   aspirin  81 mg Oral Daily   atorvastatin  80 mg Oral Daily   metoprolol tartrate  25 mg Oral BID   sodium chloride flush  3 mL Intravenous Q12H   ticagrelor  90 mg Oral BID   Continuous Infusions:  sodium chloride Stopped (05/28/22 0615)   sodium chloride     PRN Meds: sodium chloride, acetaminophen, diazepam, ondansetron (ZOFRAN) IV, sodium chloride flush   Vital Signs    Vitals:   05/27/22 1845 05/27/22 1946 05/27/22 2345 05/28/22 0545  BP: 107/84 135/87 116/77 121/77  Pulse:    67  Resp: '13 14 14 12  '$ Temp:  98.6 F (37 C) 98.5 F (36.9 C) 97.9 F (36.6 C)  TempSrc:  Oral Oral Oral  SpO2:  95% 95% 96%  Weight:      Height:        Intake/Output Summary (Last 24 hours) at 05/28/2022 0717 Last data filed at 05/28/2022 0303 Gross per 24 hour  Intake 2037.93 ml  Output 2600 ml  Net -562.07 ml      05/27/2022    7:05 AM 05/18/2022    2:37 PM 04/21/2022    1:48 PM  Last 3 Weights  Weight (lbs) 143 lb 143 lb 142 lb  Weight (kg) 64.864 kg 64.864 kg 64.411 kg      Telemetry    Sinus - Personally Reviewed   Physical Exam   GEN: No acute distress.   Neck: No JVD Cardiac: RRR, no murmurs, rubs, or gallops.  Respiratory: Clear to auscultation bilaterally. GI: Soft, nontender, non-distended, right groin with no hematoma and no bruit MS: No edema Neuro:  Nonfocal  Psych: Normal affect   Labs    Chemistry Recent Labs  Lab 05/28/22 0242  NA 136  K 4.0  CL 109  CO2 20*  GLUCOSE 90  BUN 10  CREATININE 0.74  CALCIUM 8.6*  GFRNONAA >60  ANIONGAP 7     Hematology Recent Labs  Lab 05/28/22 0242  WBC 10.3  RBC 4.32  HGB 13.8  HCT 38.7*  MCV 89.6  MCH 31.9  MCHC 35.7  RDW 14.5   PLT 254    Radiology    CARDIAC CATHETERIZATION  Result Date: 05/27/2022   Prox RCA lesion is 99% stenosed.   Mid RCA lesion is 80% stenosed.   Balloon angioplasty was performed.   Post intervention, there is a 90% residual stenosis.   Post intervention, there is a 70% residual stenosis.   The left ventricular systolic function is normal.   LV end diastolic pressure is normal.   The left ventricular ejection fraction is 50-55% by visual estimate. Severely calcified dominant RCA with 95 to 99% proximal to mid stenosis prior to the RV marginal branch followed by 80% mid stenosis. Relatively normal left main, LAD and left circumflex vessels. Preserved global LV contractility with mild subtle mid inferior hypocontractility.  EF estimate approximately 50 to 55%.  LVEDP 9 mmHg. Very difficult percutaneous coronary intervention to catheter backup difficulties with right catheter extension with a telescope catheter and PTCA utilizing 1.5, 2.0 and 2.5 x 12 mm compliant balloons and 5 runs of shockwave the proximal calcified lesion.  The procedure ultimately had to  be aborted due to excess of fluoroscopy time.  There appeared to be a stable small dissection calcified segment since the patient had stable hemodynamics and was chest pain-free without ECG changes plan is for future evaluation allowing time for stability of lesion. RECOMMENDATION: Continue DAPT at least until after his next catheterization and probably long-term with potential subsequent stenting.  Hydrate post procedure.  Patient was made aware of fluoroscopy time excess.  Increase medical therapy for anti-ischemic benefit.  PERIPHERAL VASCULAR CATHETERIZATION  Result Date: 05/27/2022   Prox RCA lesion is 99% stenosed.   Mid RCA lesion is 80% stenosed.   Balloon angioplasty was performed.   Post intervention, there is a 90% residual stenosis.   Post intervention, there is a 70% residual stenosis.   The left ventricular systolic function is normal.    LV end diastolic pressure is normal.   The left ventricular ejection fraction is 50-55% by visual estimate. Severely calcified dominant RCA with 95 to 99% proximal to mid stenosis prior to the RV marginal branch followed by 80% mid stenosis. Relatively normal left main, LAD and left circumflex vessels. Preserved global LV contractility with mild subtle mid inferior hypocontractility.  EF estimate approximately 50 to 55%.  LVEDP 9 mmHg. Very difficult percutaneous coronary intervention to catheter backup difficulties with right catheter extension with a telescope catheter and PTCA utilizing 1.5, 2.0 and 2.5 x 12 mm compliant balloons and 5 runs of shockwave the proximal calcified lesion.  The procedure ultimately had to be aborted due to excess of fluoroscopy time.  There appeared to be a stable small dissection calcified segment since the patient had stable hemodynamics and was chest pain-free without ECG changes plan is for future evaluation allowing time for stability of lesion. RECOMMENDATION: Continue DAPT at least until after his next catheterization and probably long-term with potential subsequent stenting.  Hydrate post procedure.  Patient was made aware of fluoroscopy time excess.  Increase medical therapy for anti-ischemic benefit.     Patient Profile     61 y.o. male with past medical history of hypertension, hyperlipidemia, tobacco abuse admitted following PCI.  Recently had abnormal CTA.  Cardiac catheterization performed yesterday revealed 99% proximal right coronary artery followed by 80% lesion with normal LV function.  Attempt at PCI was unsuccessful and was aborted due to excess fluoroscopy time and possible small dissection.  Assessment & Plan   1 coronary artery disease-patient is status post unsuccessful PCI of RCA.  I discussed the case with Dr. Claiborne Billings.  Plan will be to treat medically with aspirin, Brilinta, statin and antianginals.  He stated he would like to give the vessel time to  heal and then restudy in 2 weeks with possible repeat PCI at that time.  Patient agreeable.  2 hypertension-blood pressure controlled.  Continue present medications.  3 hyperlipidemia-continue statin.  4 tobacco abuse-patient counseled on discontinuing.  Plan discharge today.  We will arrange outpatient follow-up with APP in 1 week.  At that time we will reschedule cardiac catheterization with possible PCI of RCA.  Greater than 30 minutes PA and physician time D2  For questions or updates, please contact [place] Please consult [email or web address] for contact info under        Signed, Kirk Ruths, MD  05/28/2022, 7:17 AM

## 2022-05-29 LAB — LIPOPROTEIN A (LPA): Lipoprotein (a): 9.8 nmol/L (ref <75.0)

## 2022-06-01 ENCOUNTER — Other Ambulatory Visit (HOSPITAL_COMMUNITY): Payer: Self-pay

## 2022-06-04 ENCOUNTER — Encounter: Payer: Self-pay | Admitting: Nurse Practitioner

## 2022-06-04 ENCOUNTER — Ambulatory Visit (INDEPENDENT_AMBULATORY_CARE_PROVIDER_SITE_OTHER): Payer: Medicare Other | Admitting: Nurse Practitioner

## 2022-06-04 VITALS — BP 104/56 | HR 50 | Ht 64.0 in | Wt 141.4 lb

## 2022-06-04 DIAGNOSIS — Z720 Tobacco use: Secondary | ICD-10-CM | POA: Diagnosis not present

## 2022-06-04 DIAGNOSIS — I10 Essential (primary) hypertension: Secondary | ICD-10-CM

## 2022-06-04 DIAGNOSIS — E785 Hyperlipidemia, unspecified: Secondary | ICD-10-CM

## 2022-06-04 DIAGNOSIS — I25118 Atherosclerotic heart disease of native coronary artery with other forms of angina pectoris: Secondary | ICD-10-CM | POA: Diagnosis not present

## 2022-06-10 ENCOUNTER — Telehealth (HOSPITAL_BASED_OUTPATIENT_CLINIC_OR_DEPARTMENT_OTHER): Payer: Self-pay

## 2022-06-10 ENCOUNTER — Other Ambulatory Visit (HOSPITAL_BASED_OUTPATIENT_CLINIC_OR_DEPARTMENT_OTHER): Payer: Self-pay

## 2022-06-10 ENCOUNTER — Ambulatory Visit: Payer: Medicare Other | Admitting: Physician Assistant

## 2022-06-10 NOTE — Telephone Encounter (Signed)
Pharmacy Transitions of Care Follow-up Telephone Call  Date of discharge: 05/28/22  Discharge Diagnosis: CAD  How have you been since you were released from the hospital? Patient has been doing well since leaving the hospital. Is trying to get insurance, but in the meantime, his PCP gave him a sample bottle of Brilinta. Patient will call for transfers once he figures out his insurance.   Medication changes made at discharge: START taking these medications  START taking these medications  Aspirin Low Dose 81 MG tablet Generic drug: aspirin EC Take 1 tablet (81 mg total) by mouth daily. Swallow whole. Replaces: aspirin 325 MG tablet  Brilinta 90 MG Tabs tablet Generic drug: ticagrelor Take 1 tablet (90 mg total) by mouth 2 (two) times daily.  nitroGLYCERIN 0.4 MG SL tablet Commonly known as: Nitrostat Place 1 tablet (0.4 mg total) under the tongue every 5 (five) minutes as needed.   CHANGE how you take these medications  CHANGE how you take these medications  atorvastatin 80 MG tablet Commonly known as: LIPITOR Take 1 tablet (80 mg total) by mouth daily. What changed:  medication strength how much to take  metoprolol tartrate 25 MG tablet Commonly known as: LOPRESSOR Take 1 tablet (25 mg total) by mouth 2 (two) times daily. What changed:  medication strength how much to take how to take this when to take this additional instructions   CONTINUE taking these medications  CONTINUE taking these medications  amLODipine 10 MG tablet Commonly known as: NORVASC Take 1 tablet (10 mg total) by mouth daily.  calcium carbonate 500 MG chewable tablet Commonly known as: TUMS - dosed in mg elemental calcium  losartan-hydrochlorothiazide 100-12.5 MG tablet Commonly known as: HYZAAR Take 1 tablet by mouth daily.  neomycin-bacitracin-polymyxin [phone] ointment   STOP taking these medications  STOP taking these medications  acetaminophen 500 MG tablet Commonly known as:  TYLENOL  aspirin 325 MG tablet Replaced by: Aspirin Low Dose 81 MG tablet  pravastatin 40 MG tablet Commonly known as: PRAVACHOL  pravastatin 80 MG tablet Commonly known as: PRAVACHOL    Medication changes verified by the patient? yes     Medication Accessibility:  [facility]   Was the patient provided with refills on discharged medications? yes   Have all prescriptions been transferred from [facility] to home pharmacy? Patient to call once he figures out insurance.   Is the patient able to afford medications? uninsured Notable copays: Brilinta Eligible patient assistance: yes    Medication Review:  TICAGRELOR (BRILINTA) Ticagrelor 90 mg BID initiated on 05/28/22.  - Educated patient on expected duration of therapy of aspirin '81mg'$  with ticagrelor.  - Discussed importance of taking medication around the same time every day, - Reviewed potential DDIs with patient - Advised patient of medications to avoid (NSAIDs, aspirin maintenance doses>100 mg daily) - Educated that Tylenol (acetaminophen) will be the preferred analgesic to prevent risk of bleeding  - Emphasized importance of monitoring for signs and symptoms of bleeding (abnormal bruising, prolonged bleeding, nose bleeds, bleeding from gums, discolored urine, black tarry stools)  - Educated patient to notify doctor if shortness of breath or abnormal heartbeat occur - Advised patient to alert all providers of antiplatelet therapy prior to starting a new medication or having a procedure    Follow-up Appointments: F/U 06/23- [place]  If their condition worsens, is the pt aware to call PCP or go to the Emergency Dept.? yes  Final Patient Assessment: Patient has been doing well since  leaving the hospital. Is trying to get insurance, but in the meantime, his PCP gave him a sample bottle of Brilinta. Patient will call for transfers once he figures out his insurance.   Gerald Rogers, [facility] 06/10/2022 2:52 PM

## 2022-06-14 ENCOUNTER — Telehealth: Payer: Self-pay | Admitting: Nurse Practitioner

## 2022-06-14 NOTE — Telephone Encounter (Signed)
Patient was returning phone call 

## 2022-06-14 NOTE — Telephone Encounter (Signed)
Pt updated with lab results and verbalized understanding.

## 2022-06-24 ENCOUNTER — Telehealth: Payer: Self-pay | Admitting: Cardiology

## 2022-06-24 MED ORDER — ATORVASTATIN CALCIUM 80 MG PO TABS: 80.0000 mg | ORAL_TABLET | Freq: Every day | ORAL | 6 refills | Status: DC

## 2022-06-24 NOTE — Telephone Encounter (Signed)
Refill sent to pharmacy.   

## 2022-06-24 NOTE — Telephone Encounter (Signed)
*  STAT* If patient is at the pharmacy, call can be transferred to refill team.   1. Which medications need to be refilled? (please list name of each medication and dose if known)  atorvastatin (LIPITOR) 80 MG tablet  2. Which pharmacy/location (including street and city if local pharmacy) is medication to be sent to?[id number] [place], [place]  3. Do they need a 30 day or 90 day supply?  30 day   Patient only has 2 pills left.

## 2022-07-12 ENCOUNTER — Other Ambulatory Visit: Payer: Self-pay

## 2022-07-13 ENCOUNTER — Telehealth: Payer: Self-pay | Admitting: Cardiology

## 2022-07-13 MED ORDER — METOPROLOL TARTRATE 25 MG PO TABS: 12.5000 mg | ORAL_TABLET | Freq: Two times a day (BID) | ORAL | 1 refills | Status: DC

## 2022-07-13 NOTE — Telephone Encounter (Signed)
*  STAT* If patient is at the pharmacy, call can be transferred to refill team.   1. Which medications need to be refilled? (please list name of each medication and dose if known) metoprolol tartrate (LOPRESSOR) 25 MG tablet  2. Which pharmacy/location (including street and city if local pharmacy) is medication to be sent to? [facility] [id number] [facility] [facility] [place]  3. Do they need a 30 day or 90 day supply? [place]

## 2022-07-13 NOTE — Telephone Encounter (Signed)
Refill for Metoprolol 12.5 mg bid sent in. Office visit 8/9.

## 2022-07-20 NOTE — Progress Notes (Unsigned)
Office Visit    Patient Name: Gerald Rogers Date of Encounter: 07/21/2022  Primary Care Provider:  Hali Marry, MD Primary Cardiologist:  Kirk Ruths, MD  Chief Complaint    61 year old male with a history of CAD s/p unsuccessful PTCA-RCA in 05/2022, hypertension, hyperlipidemia, and tobacco use who presents for follow-up related to CAD.    Past Medical History    Past Medical History:  Diagnosis Date   Anxiety    Arteriovenous fistula of spinal cord vessels    Arthritis    Headache    Hyperlipidemia    Hypertension    Past Surgical History:  Procedure Laterality Date   angiogram spinal     ANTERIOR CERVICAL DECOMP/DISCECTOMY FUSION N/A 03/23/2018   Procedure: ANTERIOR CERVICAL DECOMPRESSION/DISCECTOMY FUSION CERVICAL FOUR-FIVE, CERVICAL FIVE-SIX;  Surgeon: Melina Schools, MD;  Location: [place];  Service: Orthopedics;  Laterality: N/A;  3 hrs   CORONARY BALLOON ANGIOPLASTY N/A 05/27/2022   Procedure: CORONARY BALLOON ANGIOPLASTY;  Surgeon: [place] Sine, MD;  Location: [place] [facility];  Service: Cardiovascular;  Laterality: N/A;   EYE SURGERY     removal of foreign body   INTRAVASCULAR LITHOTRIPSY  05/27/2022   Procedure: INTRAVASCULAR LITHOTRIPSY;  Surgeon: [place] Sine, MD;  Location: [place] [facility];  Service: Cardiovascular;;   LEFT HEART CATH AND CORONARY ANGIOGRAPHY N/A 05/27/2022   Procedure: LEFT HEART CATH AND CORONARY ANGIOGRAPHY;  Surgeon: [facility] Sine, MD;  Location: [place] [facility];  Service: Cardiovascular;  Laterality: N/A;   VAGAL NERVE STIMULATOR REMOVAL  09/2015   Dr. Gloriann Loan     Allergies  Allergies  Allergen Reactions   Cymbalta [Duloxetine Hcl] Other (See Comments)    Generalized weakness and mental status changes   Klonopin [Clonazepam] Other (See Comments)    Mental status changes    History of Present Illness    61 year old male with the above past medical history  including CAD s/p PTCA-RCA in 05/2022, hypertension,  hyperlipidemia, and tobacco use.   He was initially evaluated by Dr. Stanford Breed in May 2023 in the setting of chest pain. He noted pain in his upper chest and across the shoulders as well as his left upper extremity. The discomfort would present during exertion and would last for 30 minutes to 1 hour. He underwent coronary CTA which showed severe coronary disease in his mid RCA.  He was started on atorvastatin.  He underwent cardiac catheterization on 05/27/2022 which revealed severely calcified dominant RCA with 95 to 99% proximal to mid stenosis prior to the RV marginal branch followed by 80% mid stenosis, EF 50-55%.  He underwent very difficult PCI with intravascular lithotripsy, however, there was difficulty with catheter backup, and the procedure was  ultimately aborted due to excess fluoroscopy time. Postprocedure there were 90% and 70% residual stenoses. There also appeared to be a stable small dissection in calcified segment. However, given the patient was pain-free with normal-appearing EKG, he was deemed suitable for discharge.  Recommendations were made for increased medical regimen and future relook catheterization following stabilization for possible CSI atherectomy.  He did have a level 2 R groin hematoma upon sheath pulling.  Additionally, he had a vagal episode and his BP dropped to 51/35.  He received a bolus of normal saline.  Symptoms resolved. Pressure was held on his groin site with resolution of hematoma.  He was discharged home in stable condition.     He was last seen in the office on 06/04/2022 and  was stable from a cardiac standpoint.  He denied symptoms concerning for angina.  Metoprolol was decreased in the setting of bradycardia.  He presents today for follow-up accompanied by his sister.  Overall, he has been stable from a cardiac standpoint.  He does note some shortness of breath after taking his Brilinta.  He notes he gets to easily.  Otherwise, he denies symptoms concerning for  angina.  He continues to smoke but is working on quitting.  He is still interested in pursuing future relook catheterization if recommended.  Home Medications    Current Outpatient Medications  Medication Sig Dispense Refill   amLODipine (NORVASC) 10 MG tablet Take 1 tablet (10 mg total) by mouth daily. 90 tablet 3   aspirin EC 81 MG tablet Take 1 tablet (81 mg total) by mouth daily. Swallow whole. 90 tablet 3   atorvastatin (LIPITOR) 80 MG tablet Take 1 tablet (80 mg total) by mouth daily. 30 tablet 6   calcium carbonate (TUMS - DOSED IN MG ELEMENTAL CALCIUM) 500 MG chewable tablet Chew 500 mg by mouth at bedtime as needed for indigestion or heartburn.     losartan-hydrochlorothiazide (HYZAAR) 100-12.5 MG tablet Take 1 tablet by mouth daily. 90 tablet 3   metoprolol tartrate (LOPRESSOR) 25 MG tablet Take 0.5 tablets (12.5 mg total) by mouth 2 (two) times daily. 30 tablet 1   neomycin-bacitracin-polymyxin (NEOSPORIN) [phone] ointment Apply 1 application. topically as needed (wound care).     nitroGLYCERIN (NITROSTAT) 0.4 MG SL tablet Place 1 tablet (0.4 mg total) under the tongue every 5 (five) minutes as needed. 25 tablet 12   ticagrelor (BRILINTA) 90 MG TABS tablet Take 1 tablet (90 mg total) by mouth 2 (two) times daily. 180 tablet 3   No current facility-administered medications for this visit.     Review of Systems    He denies chest pain, palpitations, dyspnea, pnd, orthopnea, n, v, dizziness, syncope, edema, weight gain, or early satiety. All other systems reviewed and are otherwise negative except as noted above.   Physical Exam    VS:  BP 138/78   Pulse (!) 56   Ht 5' 4.5" (1.638 m)   Wt 139 lb 12.8 oz (63.4 kg)   SpO2 92%   BMI 23.63 kg/m  GEN: Well nourished, well developed, in no acute distress. HEENT: normal. Neck: Supple, no JVD, carotid bruits, or masses. Cardiac: RRR, no murmurs, rubs, or gallops. No clubbing, cyanosis, edema.  Radials/DP/PT 2+ and equal  bilaterally.  Respiratory:  Respirations regular and unlabored, clear to auscultation bilaterally. GI: Soft, nontender, nondistended, BS + x 4. MS: no deformity or atrophy. Skin: warm and dry, no rash. Neuro:  Strength and sensation are intact. Psych: Normal affect.  Accessory Clinical Findings    ECG personally reviewed by me today -sinus bradycardia, 56 bpm- no acute changes.  Lab Results  Component Value Date   WBC 10.3 05/28/2022   HGB 13.8 05/28/2022   HCT 38.7 (L) 05/28/2022   MCV 89.6 05/28/2022   PLT 254 05/28/2022   Lab Results  Component Value Date   CREATININE 0.74 05/28/2022   BUN 10 05/28/2022   NA 136 05/28/2022   K 4.0 05/28/2022   CL 109 05/28/2022   CO2 20 (L) 05/28/2022   Lab Results  Component Value Date   ALT 26 03/01/2022   AST 18 03/01/2022   ALKPHOS 87 02/15/2017   BILITOT 0.4 03/01/2022   Lab Results  Component Value Date   CHOL 151 03/01/2022  HDL 48 03/01/2022   LDLCALC 75 03/01/2022   TRIG 188 (H) 03/01/2022   CHOLHDL 3.1 03/01/2022    No results found for: "HGBA1C"  Assessment & Plan    1. CAD:  Cath on 05/27/2022 revealed severely calcified dominant RCA with 95 to 99% proximal to mid stenosis prior to the RV marginal branch followed by 80% mid stenosis, EF 50-55%.  Underwent very difficult PCI, with intravascular lithotripsy, however, due to difficulty with catheter backup, and excess fluoroscopy time, procedure was aborted.  Postprocedure there were 90% and 70% residual stenoses. There also appeared to be a stable small dissection in calcified segment.  Recommendations were made for increased medical regimen and future relook catheterization following stabilization for possible CSI atherectomy.  He denies symptoms concerning for angina.  I will reach out again to Dr. Claiborne Billings and Dr. Stanford Breed to discuss timing of future intervention.  He Notes shortness of breath with Brilinta.  Additionally, it has been cost prohibitive. Previously discussed  with Dr. Stanford Breed, will transition to Plavix. Will load with Plavix 300 mg daily followed by Plavix 75 mg daily.  Continue aspirin, Plavix, amlodipine, losartan-HCTZ, metoprolol, and Lipitor.  2. Hypertension: BP well controlled. Continue current antihypertensive regimen.   3. Hyperlipidemia: LDL was 75 in 02/2022.  Will repeat lipids, LFTs.  Continue aspirin, Lipitor.   4. Tobacco use: Full cessation advised.   5. Disposition: Follow-up as scheduled with Dr. Stanford Breed in 10/2022, sooner if needed.   Lenna Sciara, NP 07/21/2022, 9:33 AM

## 2022-07-21 ENCOUNTER — Ambulatory Visit (INDEPENDENT_AMBULATORY_CARE_PROVIDER_SITE_OTHER): Payer: Medicare Other | Admitting: Nurse Practitioner

## 2022-07-21 ENCOUNTER — Encounter: Payer: Self-pay | Admitting: Nurse Practitioner

## 2022-07-21 VITALS — BP 138/78 | HR 56 | Ht 64.5 in | Wt 139.8 lb

## 2022-07-21 DIAGNOSIS — E785 Hyperlipidemia, unspecified: Secondary | ICD-10-CM | POA: Diagnosis not present

## 2022-07-21 DIAGNOSIS — Z720 Tobacco use: Secondary | ICD-10-CM

## 2022-07-21 DIAGNOSIS — I10 Essential (primary) hypertension: Secondary | ICD-10-CM | POA: Diagnosis not present

## 2022-07-21 DIAGNOSIS — I2510 Atherosclerotic heart disease of native coronary artery without angina pectoris: Secondary | ICD-10-CM

## 2022-07-21 MED ORDER — CLOPIDOGREL BISULFATE 75 MG PO TABS
75.0000 mg | ORAL_TABLET | Freq: Every day | ORAL | 3 refills | Status: DC
Start: 2022-07-21 — End: 2022-10-13

## 2022-07-21 NOTE — Patient Instructions (Addendum)
Medication Instructions:  STOP Brilinta as directed  START Plavix 75 mg. Take 300 mg today (4 tabs) and then 75 mg one tablet daily (starting 07/22/22).   *If you need a refill on your cardiac medications before your next appointment, please call your pharmacy*   Lab Work: Your physician recommends that you return for lab work in 1-2 weeks Fasting Lipids, LFTs   If you have labs (blood work) drawn today and your tests are completely normal, you will receive your results only by: [place] (if you have MyChart) OR A paper copy in the mail If you have any lab test that is abnormal or we need to change your treatment, we will call you to review the results.   Testing/Procedures: NONE ordered at this time of appointment     Follow-Up: At [facility], you and your health needs are our priority.  As part of our continuing mission to provide you with exceptional heart care, we have created designated Provider Care Teams.  These Care Teams include your primary Cardiologist (physician) and Advanced Practice Providers (APPs -  Physician Assistants and Nurse Practitioners) who all work together to provide you with the care you need, when you need it.  We recommend signing up for the patient portal called "MyChart".  Sign up information is provided on this After Visit Summary.  MyChart is used to connect with patients for Virtual Visits (Telemedicine).  Patients are able to view lab/test results, encounter notes, upcoming appointments, etc.  Non-urgent messages can be sent to your provider as well.   To learn more about what you can do with MyChart, go to [email or web address].    Your next appointment:    Keep follow up   The format for your next appointment:   In Person  Provider:   Kirk Ruths, MD     Other Instructions   Important Information About Sugar

## 2022-07-23 DIAGNOSIS — I2510 Atherosclerotic heart disease of native coronary artery without angina pectoris: Secondary | ICD-10-CM | POA: Diagnosis not present

## 2022-07-23 DIAGNOSIS — I10 Essential (primary) hypertension: Secondary | ICD-10-CM | POA: Diagnosis not present

## 2022-07-23 DIAGNOSIS — E785 Hyperlipidemia, unspecified: Secondary | ICD-10-CM | POA: Diagnosis not present

## 2022-07-24 LAB — HEPATIC FUNCTION PANEL
ALT: 28 IU/L (ref 0–44)
AST: 19 IU/L (ref 0–40)
Albumin: 4.8 g/dL (ref 3.9–4.9)
Alkaline Phosphatase: 91 IU/L (ref 44–121)
Bilirubin Total: 0.4 mg/dL (ref 0.0–1.2)
Bilirubin, Direct: 0.13 mg/dL (ref 0.00–0.40)
Total Protein: 7.5 g/dL (ref 6.0–8.5)

## 2022-07-24 LAB — LIPID PANEL
Chol/HDL Ratio: 3.2 ratio (ref 0.0–5.0)
Cholesterol, Total: 126 mg/dL (ref 100–199)
HDL: 40 mg/dL (ref >39)
LDL Chol Calc (NIH): 61 mg/dL (ref 0–99)
Triglycerides: 141 mg/dL (ref 0–149)
VLDL Cholesterol Cal: 25 mg/dL (ref 5–40)

## 2022-07-29 ENCOUNTER — Telehealth: Payer: Self-pay

## 2022-07-29 NOTE — Telephone Encounter (Signed)
Spoke with pt. Pt was notified of lab results. Pt will continue her current medications and follow up as planned.  

## 2022-08-04 ENCOUNTER — Other Ambulatory Visit: Payer: Self-pay | Admitting: Nurse Practitioner

## 2022-09-07 ENCOUNTER — Ambulatory Visit: Payer: Medicare Other | Admitting: Family Medicine

## 2022-09-21 ENCOUNTER — Ambulatory Visit (INDEPENDENT_AMBULATORY_CARE_PROVIDER_SITE_OTHER): Payer: Medicare Other | Admitting: Family Medicine

## 2022-09-21 VITALS — BP 125/75 | HR 57 | Ht 64.5 in | Wt 142.0 lb

## 2022-09-21 DIAGNOSIS — I2510 Atherosclerotic heart disease of native coronary artery without angina pectoris: Secondary | ICD-10-CM

## 2022-09-21 DIAGNOSIS — Z23 Encounter for immunization: Secondary | ICD-10-CM | POA: Diagnosis not present

## 2022-09-21 DIAGNOSIS — I10 Essential (primary) hypertension: Secondary | ICD-10-CM | POA: Diagnosis not present

## 2022-09-21 NOTE — Assessment & Plan Note (Signed)
Blood pressure looks great today.  Labs are up-to-date he had those done through cardiology over the summer.  Low up in 6 to 7 months.

## 2022-09-21 NOTE — Assessment & Plan Note (Signed)
Follows back up with cardiology in November.

## 2022-09-21 NOTE — Progress Notes (Signed)
   Established Patient Office Visit  Subjective   Patient ID: Gerald Rogers, male    DOB: 04-19-1961  Age: 61 y.o. MRN: [id number]  Chief Complaint  Patient presents with   Follow-up   Hypertension    HPI  Hypertension- Pt denies chest pain, SOB, dizziness, or heart palpitations.  Taking meds as directed w/o problems.  Denies medication side effects.     He did have a cardiac catheterization for coronary artery disease back in June since I last saw him.  They were unable to get stents placed at that time but are considering going back in the future.    ROS    Objective:     BP 125/75   Pulse (!) 57   Ht 5' 4.5" (1.638 m)   Wt 142 lb (64.4 kg)   SpO2 98%   BMI 24.00 kg/m    Physical Exam Constitutional:      Appearance: He is well-developed.  HENT:     Head: Normocephalic and atraumatic.  Cardiovascular:     Rate and Rhythm: Normal rate and regular rhythm.     Heart sounds: Normal heart sounds.  Pulmonary:     Effort: Pulmonary effort is normal.     Breath sounds: Normal breath sounds.  Skin:    General: Skin is warm and dry.  Neurological:     Mental Status: He is alert and oriented to person, place, and time.  Psychiatric:        Behavior: Behavior normal.     No results found for any visits on 09/21/22.    The ASCVD Risk score (Arnett DK, et al., 2019) failed to calculate for the following reasons:   The valid total cholesterol range is 130 to 320 mg/dL    Assessment & Plan:   Problem List Items Addressed This Visit       Cardiovascular and Mediastinum   Coronary artery disease involving native heart without angina pectoris   CAD in native artery    Follows back up with cardiology in November.      Benign essential hypertension - Primary    Blood pressure looks great today.  Labs are up-to-date he had those done through cardiology over the summer.  Low up in 6 to 7 months.      Other Visit Diagnoses     Flu vaccine need        Relevant Orders   Flu Vaccine QUAD 46moIM (Fluarix, Fluzone & Alfiuria Quad PF) (Completed)       Given flu vaccine today.  Also discussed need for updated pneumonia vaccine but he says a wait till next time.  Return in about 7 months (around 04/22/2023) for Hypertension.    CBeatrice Lecher MD

## 2022-09-29 NOTE — Progress Notes (Addendum)
HPI: FU CAD.  Cardiac CTA May 2023 showed calcium score 1015 which was 95th percentile, severe stenosis in the right coronary artery, severe stenosis in the proximal second diagonal and otherwise mild disease.  Cardiac catheterization June 2023 showed 99% proximal RCA, 80% mid RCA, normal LV function.  Attempt at PCI of the right coronary artery was unsuccessful.  He has been treated medically since that time.  Since last seen patient denies dyspnea, chest pain, palpitations or syncope.  Current Outpatient Medications  Medication Sig Dispense Refill   amLODipine (NORVASC) 10 MG tablet Take 1 tablet (10 mg total) by mouth daily. 90 tablet 3   aspirin EC 81 MG tablet Take 1 tablet (81 mg total) by mouth daily. Swallow whole. 90 tablet 3   atorvastatin (LIPITOR) 80 MG tablet Take 1 tablet (80 mg total) by mouth daily. 30 tablet 6   calcium carbonate (TUMS - DOSED IN MG ELEMENTAL CALCIUM) 500 MG chewable tablet Chew 500 mg by mouth at bedtime as needed for indigestion or heartburn.     clopidogrel (PLAVIX) 75 MG tablet Take 1 tablet (75 mg total) by mouth daily. 90 tablet 3   losartan-hydrochlorothiazide (HYZAAR) 100-12.5 MG tablet Take 1 tablet by mouth daily. 90 tablet 3   metoprolol tartrate (LOPRESSOR) 25 MG tablet TAKE 0.5 TABLETS BY MOUTH 2 TIMES DAILY. 90 tablet 3   neomycin-bacitracin-polymyxin (NEOSPORIN) [phone] ointment Apply 1 application. topically as needed (wound care).     nitroGLYCERIN (NITROSTAT) 0.4 MG SL tablet Place 1 tablet (0.4 mg total) under the tongue every 5 (five) minutes as needed. 25 tablet 12   No current facility-administered medications for this visit.     Past Medical History:  Diagnosis Date   Anxiety    Arteriovenous fistula of spinal cord vessels    Arthritis    Headache    Hyperlipidemia    Hypertension     Past Surgical History:  Procedure Laterality Date   angiogram spinal     ANTERIOR CERVICAL DECOMP/DISCECTOMY FUSION N/A 03/23/2018    Procedure: ANTERIOR CERVICAL DECOMPRESSION/DISCECTOMY FUSION CERVICAL FOUR-FIVE, CERVICAL FIVE-SIX;  Surgeon: Melina Schools, MD;  Location: [facility];  Service: Orthopedics;  Laterality: N/A;  3 hrs   CORONARY BALLOON ANGIOPLASTY N/A 05/27/2022   Procedure: CORONARY BALLOON ANGIOPLASTY;  Surgeon: [facility] Sine, MD;  Location: [place] [facility];  Service: Cardiovascular;  Laterality: N/A;   EYE SURGERY     removal of foreign body   INTRAVASCULAR LITHOTRIPSY  05/27/2022   Procedure: INTRAVASCULAR LITHOTRIPSY;  Surgeon: Troy Sine, MD;  Location: [facility] [facility];  Service: Cardiovascular;;   LEFT HEART CATH AND CORONARY ANGIOGRAPHY N/A 05/27/2022   Procedure: LEFT HEART CATH AND CORONARY ANGIOGRAPHY;  Surgeon: Troy Sine, MD;  Location: [facility] [facility];  Service: Cardiovascular;  Laterality: N/A;   VAGAL NERVE STIMULATOR REMOVAL  09/2015   Dr. Gloriann Loan     Social History   Socioeconomic History   Marital status: Divorced    Spouse name: Not on file   Number of children: 1   Years of education: Not on file   Highest education level: Not on file  Occupational History   Not on file  Tobacco Use   Smoking status: Every Day    Packs/day: 1.00    Types: Cigarettes, E-cigarettes    Start date: 12/13/1970    Last attempt to quit: 02/11/2018    Years since quitting: 4.6   Smokeless tobacco: Current   Tobacco  comments:    1 pack a day   Vaping Use   Vaping Use: Every day  Substance and Sexual Activity   Alcohol use: Yes    Comment: 6-10 beers once/week   Drug use: No   Sexual activity: Yes    Partners: Female  Other Topics Concern   Not on file  Social History Narrative   No regular exercise.     Social Determinants of Health   Financial Resource Strain: Not on file  Food Insecurity: Not on file  Transportation Needs: Not on file  Physical Activity: Not on file  Stress: Not on file  Social Connections: Not on file  Intimate Partner Violence: Not on file    Family  History  Problem Relation Age of Onset   Heart attack Mother    Heart disease Mother 77   Hypertension Mother    Cancer Father    Stroke Maternal Aunt     ROS: no fevers or chills, productive cough, hemoptysis, dysphasia, odynophagia, melena, hematochezia, dysuria, hematuria, rash, seizure activity, orthopnea, PND, pedal edema, claudication. Remaining systems are negative.  Physical Exam: Well-developed well-nourished in no acute distress.  Skin is warm and dry.  HEENT is normal.  Neck is supple.  Chest is clear to auscultation with normal expansion.  Cardiovascular exam is regular rate and rhythm.  Abdominal exam nontender or distended. No masses palpated. Extremities show no edema. neuro grossly intact  ECG- personally reviewed  A/P  1 coronary artery disease-continue aspirin, Plavix and statin.  Continue amlodipine and Toprol.  I will review his films with our interventional colleagues.  Question repeat attempt at PCI of RCA.  He is not having chest pain and therefore best option may be medical therapy.  2 hypertension-patient's blood pressure is controlled.  Continue present medications.  3 hyperlipidemia-continue statin.  4 tobacco abuse-patient again counseled on discontinuing.  Kirk Ruths, MD

## 2022-10-13 ENCOUNTER — Ambulatory Visit (INDEPENDENT_AMBULATORY_CARE_PROVIDER_SITE_OTHER): Payer: Medicare Other | Admitting: Cardiology

## 2022-10-13 ENCOUNTER — Encounter: Payer: Self-pay | Admitting: Cardiology

## 2022-10-13 VITALS — BP 124/76 | HR 54 | Ht 64.0 in | Wt 141.8 lb

## 2022-10-13 DIAGNOSIS — E785 Hyperlipidemia, unspecified: Secondary | ICD-10-CM

## 2022-10-13 DIAGNOSIS — I2510 Atherosclerotic heart disease of native coronary artery without angina pectoris: Secondary | ICD-10-CM | POA: Diagnosis not present

## 2022-10-13 DIAGNOSIS — I10 Essential (primary) hypertension: Secondary | ICD-10-CM | POA: Diagnosis not present

## 2022-10-13 MED ORDER — CLOPIDOGREL BISULFATE 75 MG PO TABS: 75.0000 mg | ORAL_TABLET | Freq: Every day | ORAL | 3 refills | Status: DC

## 2022-10-13 NOTE — Patient Instructions (Signed)
  Follow-Up: At [facility], you and your health needs are our priority.  As part of our continuing mission to provide you with exceptional heart care, we have created designated Provider Care Teams.  These Care Teams include your primary Cardiologist (physician) and Advanced Practice Providers (APPs -  Physician Assistants and Nurse Practitioners) who all work together to provide you with the care you need, when you need it.  We recommend signing up for the patient portal called "MyChart".  Sign up information is provided on this After Visit Summary.  MyChart is used to connect with patients for Virtual Visits (Telemedicine).  Patients are able to view lab/test results, encounter notes, upcoming appointments, etc.  Non-urgent messages can be sent to your provider as well.   To learn more about what you can do with MyChart, go to [email or web address]://[email or web address].com.    Your next appointment:   6 month(s)  The format for your next appointment:   In Person  Provider:   Brian Crenshaw, MD    

## 2022-10-14 ENCOUNTER — Telehealth: Payer: Self-pay | Admitting: *Deleted

## 2022-10-14 NOTE — Telephone Encounter (Signed)
Left message for patient,per dr Stanford Breed, he looked at the patients' \\cath'$  films with an interventional cardiologist and they are going to continue medical treatment at this time. I encouraged the patient to make sure to let us know if he were to start having any symptoms.

## 2022-11-16 ENCOUNTER — Telehealth: Payer: Self-pay | Admitting: General Practice

## 2022-11-16 NOTE — Telephone Encounter (Signed)
Left message for patient to call and schedule annual/medicare wellness visit.

## 2023-01-12 ENCOUNTER — Other Ambulatory Visit: Payer: Self-pay | Admitting: Cardiology

## 2023-04-21 ENCOUNTER — Ambulatory Visit (INDEPENDENT_AMBULATORY_CARE_PROVIDER_SITE_OTHER): Payer: No Typology Code available for payment source | Admitting: Family Medicine

## 2023-04-21 ENCOUNTER — Encounter: Payer: Self-pay | Admitting: Family Medicine

## 2023-04-21 VITALS — BP 116/59 | HR 59 | Ht 64.0 in | Wt 144.0 lb

## 2023-04-21 DIAGNOSIS — E7800 Pure hypercholesterolemia, unspecified: Secondary | ICD-10-CM | POA: Diagnosis not present

## 2023-04-21 DIAGNOSIS — G959 Disease of spinal cord, unspecified: Secondary | ICD-10-CM

## 2023-04-21 DIAGNOSIS — Z23 Encounter for immunization: Secondary | ICD-10-CM

## 2023-04-21 DIAGNOSIS — I2510 Atherosclerotic heart disease of native coronary artery without angina pectoris: Secondary | ICD-10-CM | POA: Diagnosis not present

## 2023-04-21 DIAGNOSIS — I10 Essential (primary) hypertension: Secondary | ICD-10-CM

## 2023-04-21 DIAGNOSIS — H6121 Impacted cerumen, right ear: Secondary | ICD-10-CM

## 2023-04-21 DIAGNOSIS — Z125 Encounter for screening for malignant neoplasm of prostate: Secondary | ICD-10-CM

## 2023-04-21 LAB — CBC
HCT: 48.6 % (ref 38.5–50.0)
MCH: 31.5 pg (ref 27.0–33.0)
MCHC: 33.5 g/dL (ref 32.0–36.0)
MCV: 93.8 fL (ref 80.0–100.0)

## 2023-04-21 NOTE — Assessment & Plan Note (Addendum)
Follows with neurology at [facility].  Typically uses either a walker or cane to assist mobility.

## 2023-04-21 NOTE — Assessment & Plan Note (Signed)
Blood pressure looks phenomenal today.  Will get updated CMP.  Follow-up in 6 months.

## 2023-04-21 NOTE — Progress Notes (Signed)
   Established Patient Office Visit  Subjective   Patient ID: Gerald Rogers, male    DOB: 04-Jun-1961  Age: 62 y.o. MRN: [id number]  Chief Complaint  Patient presents with   Hypertension    HPI   Hypertension- Pt denies chest pain, SOB, dizziness, or heart palpitations.  Taking meds as directed w/o problems.  Denies medication side effects.    He also wants me to check his right ear he thinks it might be full of wax and just feels stopped up at times.  He follows with cardiology regularly.  He is not having any problems getting his medication including his Plavix.     ROS    Objective:     BP (!) 116/59   Pulse (!) 59   Ht 5\' 4"  (1.626 m)   Wt 144 lb (65.3 kg)   SpO2 97%   BMI 24.72 kg/m    Physical Exam Constitutional:      Appearance: He is well-developed.  HENT:     Head: Normocephalic and atraumatic.  Cardiovascular:     Rate and Rhythm: Normal rate and regular rhythm.     Heart sounds: Normal heart sounds.  Pulmonary:     Effort: Pulmonary effort is normal.     Breath sounds: Normal breath sounds.  Skin:    General: Skin is warm and dry.  Neurological:     Mental Status: He is alert and oriented to person, place, and time.  Psychiatric:        Behavior: Behavior normal.      No results found for any visits on 04/21/23.    The ASCVD Risk score (Arnett DK, et al., 2019) failed to calculate for the following reasons:   The valid total cholesterol range is 130 to 320 mg/dL    Assessment & Plan:   Problem List Items Addressed This Visit       Cardiovascular and Mediastinum   Coronary artery disease involving native heart without angina pectoris    On a statin.       Benign essential hypertension - Primary    Blood pressure looks phenomenal today.  Will get updated CMP.  Follow-up in 6 months.      Relevant Orders   COMPLETE METABOLIC PANEL WITH GFR   CBC     Nervous and Auditory   Myelopathy (HCC)    Follows with neurology at  [facility].  Typically uses either a walker or cane to assist mobility.        Other   Hyperlipidemia    Due for lipid check in August.        Relevant Orders   COMPLETE METABOLIC PANEL WITH GFR   CBC   Other Visit Diagnoses     Screening for malignant neoplasm of prostate       Hearing loss of right ear due to cerumen impaction       Encounter for immunization       Relevant Orders   Pneumococcal conjugate vaccine 20-valent (Completed)      Pneumonia vaccine given today.  Discussed need for shingles vaccine.  Return in about 6 months (around 10/22/2023) for Hypertension.    Nani Gasser, MD

## 2023-04-21 NOTE — Assessment & Plan Note (Signed)
Due for lipid check in August.

## 2023-04-21 NOTE — Assessment & Plan Note (Addendum)
On a statin 

## 2023-04-21 NOTE — Patient Instructions (Signed)
Please schedule your Medicare Wellness exam with [facility].

## 2023-04-22 LAB — CBC
Hemoglobin: 16.3 g/dL (ref 13.2–17.1)
MPV: 11.8 fL (ref 7.5–12.5)
Platelets: 237 10*3/uL (ref 140–400)
RBC: 5.18 10*6/uL (ref 4.20–5.80)
RDW: 13.6 % (ref 11.0–15.0)
WBC: 9.4 10*3/uL (ref 3.8–10.8)

## 2023-04-22 LAB — COMPLETE METABOLIC PANEL WITH GFR
AG Ratio: 1.6 (calc) (ref 1.0–2.5)
ALT: 23 U/L (ref 9–46)
AST: 17 U/L (ref 10–35)
Albumin: 4.4 g/dL (ref 3.6–5.1)
Alkaline phosphatase (APISO): 67 U/L (ref 35–144)
BUN: 16 mg/dL (ref 7–25)
CO2: 28 mmol/L (ref 20–32)
Calcium: 10.4 mg/dL — ABNORMAL HIGH (ref 8.6–10.3)
Chloride: 96 mmol/L — ABNORMAL LOW (ref 98–110)
Creat: 0.81 mg/dL (ref 0.70–1.35)
Globulin: 2.8 g/dL (calc) (ref 1.9–3.7)
Glucose, Bld: 83 mg/dL (ref 65–99)
Potassium: 4.8 mmol/L (ref 3.5–5.3)
Sodium: 135 mmol/L (ref 135–146)
Total Bilirubin: 0.4 mg/dL (ref 0.2–1.2)
Total Protein: 7.2 g/dL (ref 6.1–8.1)
eGFR: 99 mL/min/{1.73_m2} (ref >=60)

## 2023-04-22 NOTE — Progress Notes (Signed)
Your lab work is within acceptable range and there are no concerning findings.   ?

## 2023-04-26 ENCOUNTER — Ambulatory Visit (INDEPENDENT_AMBULATORY_CARE_PROVIDER_SITE_OTHER): Payer: No Typology Code available for payment source | Admitting: Family Medicine

## 2023-04-26 DIAGNOSIS — Z0000 Encounter for general adult medical examination without abnormal findings: Secondary | ICD-10-CM

## 2023-04-26 NOTE — Progress Notes (Signed)
MEDICARE ANNUAL WELLNESS VISIT  04/26/2023  Telephone Visit Disclaimer This Medicare AWV was conducted by telephone due to national recommendations for restrictions regarding the COVID-19 Pandemic (e.g. social distancing).  I verified, using two identifiers, that I am speaking with Gerald Rogers or their authorized healthcare agent. I discussed the limitations, risks, security, and privacy concerns of performing an evaluation and management service by telephone and the potential availability of an in-person appointment in the future. The patient expressed understanding and agreed to proceed.  Location of Patient: Home Location of Provider (nurse):  In the office.  Subjective:    Gerald Rogers is a 62 y.o. male patient of Metheney, Barbarann Ehlers, MD who had a Medicare Annual Wellness Visit today via telephone. Gerald Rogers is Retired and lives with his sister. he has 1 child. he reports that he is socially active and does interact with friends/family regularly. he is minimally physically active and enjoys watching television and browsing on the computer.  Patient Care Team: Agapito Games, MD as PCP - General Jens Som Madolyn Frieze, MD as PCP - Cardiology (Cardiology) Burnett Corrente, MD as Referring Physician (Specialist)     04/26/2023    8:05 AM 05/28/2022    8:05 AM 05/27/2022    7:30 AM 03/23/2018    5:45 PM 03/23/2018    8:39 AM 03/20/2018   10:39 AM  Advanced Directives  Does Patient Have a Medical Advance Directive? No  No No No No  Would patient like information on creating a medical advance directive? No - Patient declined No - Patient declined  No - Patient declined No - Patient declined Yes (MAU/Ambulatory/Procedural Areas - Information given)    Hospital Utilization Over the Past 12 Months: # of hospitalizations or ER visits: 1 # of surgeries: 2  Review of Systems    Patient reports that his overall health is unchanged compared to last year.  History obtained from chart  review and the patient  Patient Reported Readings (BP, Pulse, CBG, Weight, etc) none  Pain Assessment Pain : No/denies pain     Current Medications & Allergies (verified) Allergies as of 04/26/2023       Reactions   Cymbalta [duloxetine Hcl] Other (See Comments)   Generalized weakness and mental status changes   Klonopin [clonazepam] Other (See Comments)   Mental status changes        Medication List        Accurate as of Apr 26, 2023  8:15 AM. If you have any questions, ask your nurse or doctor.          amLODipine 10 MG tablet Commonly known as: NORVASC Take 1 tablet (10 mg total) by mouth daily.   Aspirin Low Dose 81 MG tablet Generic drug: aspirin EC Take 1 tablet (81 mg total) by mouth daily. Swallow whole.   atorvastatin 80 MG tablet Commonly known as: LIPITOR TAKE 1 TABLET BY MOUTH EVERY DAY   calcium carbonate 500 MG chewable tablet Commonly known as: TUMS - dosed in mg elemental calcium Chew 500 mg by mouth at bedtime as needed for indigestion or heartburn.   clopidogrel 75 MG tablet Commonly known as: PLAVIX Take 1 tablet (75 mg total) by mouth daily.   losartan-hydrochlorothiazide 100-12.5 MG tablet Commonly known as: HYZAAR Take 1 tablet by mouth daily.   metoprolol tartrate 25 MG tablet Commonly known as: LOPRESSOR TAKE 0.5 TABLETS BY MOUTH 2 TIMES DAILY.   nitroGLYCERIN 0.4 MG SL tablet Commonly known as: Futures trader  1 tablet (0.4 mg total) under the tongue every 5 (five) minutes as needed.        History (reviewed): Past Medical History:  Diagnosis Date   Anxiety    Arteriovenous fistula of spinal cord vessels    Arthritis    Headache    Hyperlipidemia    Hypertension    Past Surgical History:  Procedure Laterality Date   angiogram spinal     ANTERIOR CERVICAL DECOMP/DISCECTOMY FUSION N/A 03/23/2018   Procedure: ANTERIOR CERVICAL DECOMPRESSION/DISCECTOMY FUSION CERVICAL FOUR-FIVE, CERVICAL FIVE-SIX;  Surgeon: Venita Lick, MD;  Location: [facility] OR;  Service: Orthopedics;  Laterality: N/A;  3 hrs   CORONARY BALLOON ANGIOPLASTY N/A 05/27/2022   Procedure: CORONARY BALLOON ANGIOPLASTY;  Surgeon: Lennette Bihari, MD;  Location: [facility] INVASIVE CV LAB;  Service: Cardiovascular;  Laterality: N/A;   EYE SURGERY     removal of foreign body   LEFT HEART CATH AND CORONARY ANGIOGRAPHY N/A 05/27/2022   Procedure: LEFT HEART CATH AND CORONARY ANGIOGRAPHY;  Surgeon: Lennette Bihari, MD;  Location: [facility] INVASIVE CV LAB;  Service: Cardiovascular;  Laterality: N/A;   PERIPHERAL INTRAVASCULAR LITHOTRIPSY  05/27/2022   Procedure: INTRAVASCULAR LITHOTRIPSY;  Surgeon: Lennette Bihari, MD;  Location: [facility] INVASIVE CV LAB;  Service: Cardiovascular;;   VAGAL NERVE STIMULATOR REMOVAL  09/2015   Dr. Alvester Morin    Family History  Problem Relation Age of Onset   Heart attack Mother    Heart disease Mother 44   Hypertension Mother    Cancer Father    Stroke Maternal Aunt    Social History   Socioeconomic History   Marital status: Divorced    Spouse name: Not on file   Number of children: 1   Years of education: 12   Highest education level: GED or equivalent  Occupational History   Occupation: Disabled  Tobacco Use   Smoking status: Every Day    Packs/day: 1.00    Years: 49.00    Additional pack years: 0.00    Total pack years: 49.00    Types: Cigarettes, E-cigarettes    Start date: 12/13/1970    Last attempt to quit: 02/11/2018    Years since quitting: 5.2   Smokeless tobacco: Current   Tobacco comments:    1 pack a day   Vaping Use   Vaping Use: Former  Substance and Sexual Activity   Alcohol use: Yes    Comment: 6-10 beers once/week   Drug use: No   Sexual activity: Yes    Partners: Female  Other Topics Concern   Not on file  Social History Narrative   Lives with his sister. He has one child. He enjoys watching television and browsing on the computer.   Social Determinants of Health   Financial Resource Strain: Low Risk   (04/26/2023)   Overall Financial Resource Strain (CARDIA)    Difficulty of Paying Living Expenses: Not hard at all  Food Insecurity: No Food Insecurity (04/26/2023)   Hunger Vital Sign    Worried About Running Out of Food in the Last Year: Never true    Ran Out of Food in the Last Year: Never true  Transportation Needs: No Transportation Needs (04/26/2023)   PRAPARE - Administrator, Civil Service (Medical): No    Lack of Transportation (Non-Medical): No  Physical Activity: Inactive (04/26/2023)   Exercise Vital Sign    Days of Exercise per Week: 0 days    Minutes of Exercise per Session: 0 min  Stress: No Stress Concern Present (04/26/2023)   [employer] of Occupational Health - Occupational Stress Questionnaire    Feeling of Stress : Not at all  Social Connections: Socially Isolated (04/26/2023)   Social Connection and Isolation Panel [NHANES]    Frequency of Communication with Friends and Family: More than three times a week    Frequency of Social Gatherings with Friends and Family: More than three times a week    Attends Religious Services: Never    Database administrator or Organizations: No    Attends Banker Meetings: Never    Marital Status: Divorced    Activities of Daily Living    04/26/2023    8:07 AM 05/28/2022    8:00 AM  In your present state of health, do you have any difficulty performing the following activities:  Hearing? 0 0  Vision? 0 0  Difficulty concentrating or making decisions? 0 0  Walking or climbing stairs? 1 0  Comment uses a walker and a cane   Dressing or bathing? 0 0  Doing errands, shopping? 1 1  Comment doesn't drive anymore   Preparing Food and eating ? N   Using the Toilet? N   In the past six months, have you accidently leaked urine? N   Do you have problems with loss of bowel control? N   Managing your Medications? N   Managing your Finances? N   Housekeeping or managing your Housekeeping? N     Patient  Education/ Literacy How often do you need to have someone help you when you read instructions, pamphlets, or other written materials from your doctor or pharmacy?: 1 - Never What is the last grade level you completed in school?: 11th grade + GED  Exercise Current Exercise Habits: The patient does not participate in regular exercise at present, Exercise limited by: orthopedic condition(s)  Diet Patient reports consuming 2 meals a day and 1 snack(s) a day Patient reports that his primary diet is: Regular Patient reports that she does have regular access to food.   Depression Screen    04/26/2023    8:06 AM 04/21/2023    9:31 AM 09/21/2022    9:14 AM 03/01/2022   10:35 AM 12/15/2021   11:09 AM 08/31/2021   10:41 AM 02/25/2021    9:51 AM  PHQ 2/9 Scores  PHQ - 2 Score 0 0 0 0 0 0 0     Fall Risk    04/26/2023    8:05 AM 04/21/2023    9:33 AM 04/21/2023    9:31 AM 09/21/2022    9:13 AM 03/01/2022   10:35 AM  Fall Risk   Falls in the past year? 0 0  0 0  Number falls in past yr: 0 0 0 0 0  Injury with Fall? 0 0 0 0 0  Risk for fall due to : Impaired mobility No Fall Risks No Fall Risks No Fall Risks No Fall Risks  Follow up Falls evaluation completed Falls evaluation completed Falls evaluation completed Falls evaluation completed Falls prevention discussed;Falls evaluation completed     Objective:  Gerald Rogers seemed alert and oriented and he participated appropriately during our telephone visit.  Blood Pressure Weight BMI  BP Readings from Last 3 Encounters:  04/21/23 (!) 116/59  10/13/22 124/76  09/21/22 125/75   Wt Readings from Last 3 Encounters:  04/21/23 144 lb (65.3 kg)  10/13/22 141 lb 12.8 oz (64.3 kg)  09/21/22 142 lb (64.4 kg)  BMI Readings from Last 1 Encounters:  04/21/23 24.72 kg/m    *Unable to obtain current vital signs, weight, and BMI due to telephone visit type  Hearing/Vision  Shizuo did not seem to have difficulty with hearing/understanding during  the telephone conversation Reports that he has not had a formal eye exam by an eye care professional within the past year Reports that he has not had a formal hearing evaluation within the past year *Unable to fully assess hearing and vision during telephone visit type  Cognitive Function:    04/26/2023    8:09 AM  6CIT Screen  What Year? 0 points  What month? 0 points  What time? 0 points  Count back from 20 0 points  Months in reverse 0 points  Repeat phrase 0 points  Total Score 0 points   (Normal:0-7, Significant for Dysfunction: >8)  Normal Cognitive Function Screening: Yes   Immunization & Health Maintenance Record Immunization History  Administered Date(s) Administered   Influenza,inj,Quad PF,6+ Mos 01/03/2014, 11/13/2015, 02/15/2017, 08/18/2017, 08/24/2018, 08/27/2019, 08/28/2020, 08/31/2021, 09/21/2022   Influenza,inj,quad, With Preservative 09/04/2018   Influenza-Unspecified 09/19/2017   Moderna Sars-Covid-2 Vaccination 03/15/2020, 04/12/2020   PNEUMOCOCCAL CONJUGATE-20 04/21/2023   Tdap 04/23/2015    Health Maintenance  Topic Date Due   Zoster Vaccines- Shingrix (1 of 2) 07/27/2023 (Originally 10/03/1980)   COVID-19 Vaccine (3 - Moderna risk series) 05/06/2024 (Originally 05/10/2020)   INFLUENZA VACCINE  07/14/2023   Medicare Annual Wellness (AWV)  04/25/2024   Fecal DNA (Cologuard)  03/15/2025   DTaP/Tdap/Td (2 - Td or Tdap) 04/22/2025   Pneumococcal Vaccine 43-61 Years old  Completed   Hepatitis C Screening  Completed   HIV Screening  Completed   HPV VACCINES  Aged Out   COLONOSCOPY (Pts 45-40yrs Insurance coverage will need to be confirmed)  Discontinued       Assessment  This is a routine wellness examination for [employer].  Health Maintenance: Due or Overdue There are no preventive care reminders to display for this patient.   Gerald Rogers does not need a referral for Community Assistance: Care Management:   no Social  Work:    no Prescription Assistance:  no Nutrition/Diabetes Education:  no   Plan:  Personalized Goals  Goals Addressed               This Visit's Progress     Patient Stated (pt-stated)        Patient stated that he would like to continue to maintain his current lifestyle.       Personalized Health Maintenance & Screening Recommendations  Shingles vaccine Lung cancer screening recommended  Lung Cancer Screening Recommended: yes; declined (Low Dose CT Chest recommended if Age 22-80 years, 20 pack-year currently smoking OR have quit w/in past 15 years) Hepatitis C Screening recommended: no HIV Screening recommended: no  Advanced Directives: Written information was not prepared per patient's request.  Referrals & Orders No orders of the defined types were placed in this encounter.   Follow-up Plan Follow-up with Agapito Games, MD as planned Schedule shingles vaccine at the pharmacy. Medicare wellness visit in one year. AVS printed and mailed to the patient.   I have personally reviewed and noted the following in the patient's chart:   Medical and social history Use of alcohol, tobacco or illicit drugs  Current medications and supplements Functional ability and status Nutritional status Physical activity Advanced directives List of other physicians Hospitalizations, surgeries, and ER visits in previous 12 months  Vitals Screenings to include cognitive, depression, and falls Referrals and appointments  In addition, I have reviewed and discussed with Gerald Rogers certain preventive protocols, quality metrics, and best practice recommendations. A written personalized care plan for preventive services as well as general preventive health recommendations is available and can be mailed to the patient at his request.      Modesto Charon, RN BSN  04/26/2023

## 2023-04-26 NOTE — Patient Instructions (Addendum)
MEDICARE ANNUAL WELLNESS VISIT Health Maintenance Summary and Written Plan of Care  Mr. Gerald Rogers ,  Thank you for allowing me to perform your Medicare Annual Wellness Visit and for your ongoing commitment to your health.   Health Maintenance & Immunization History Health Maintenance  Topic Date Due   Zoster Vaccines- Shingrix (1 of 2) 07/27/2023 (Originally 10/03/1980)   COVID-19 Vaccine (3 - Moderna risk series) 05/06/2024 (Originally 05/10/2020)   INFLUENZA VACCINE  07/14/2023   Medicare Annual Wellness (AWV)  04/25/2024   Fecal DNA (Cologuard)  03/15/2025   DTaP/Tdap/Td (2 - Td or Tdap) 04/22/2025   Pneumococcal Vaccine 40-59 Years old  Completed   Hepatitis C Screening  Completed   HIV Screening  Completed   HPV VACCINES  Aged Out   COLONOSCOPY (Pts 45-2yrs Insurance coverage will need to be confirmed)  Discontinued   Immunization History  Administered Date(s) Administered   Influenza,inj,Quad PF,6+ Mos 01/03/2014, 11/13/2015, 02/15/2017, 08/18/2017, 08/24/2018, 08/27/2019, 08/28/2020, 08/31/2021, 09/21/2022   Influenza,inj,quad, With Preservative 09/04/2018   Influenza-Unspecified 09/19/2017   Moderna Sars-Covid-2 Vaccination 03/15/2020, 04/12/2020   PNEUMOCOCCAL CONJUGATE-20 04/21/2023   Tdap 04/23/2015    These are the patient goals that we discussed:  Goals Addressed               This Visit's Progress     Patient Stated (pt-stated)        Patient stated that he would like to continue to maintain his current lifestyle.         This is a list of Health Maintenance Items that are overdue or due now: Shingles vaccine Lung cancer screening recommended  Orders/Referrals Placed Today: No orders of the defined types were placed in this encounter.  (Contact our referral department at [phone] if you have not spoken with someone about your referral appointment within the next 5 days)    Follow-up Plan Follow-up with Agapito Games, MD as  planned Schedule shingles vaccine at the pharmacy. Medicare wellness visit in one year. AVS printed and mailed to the patient.      Health Maintenance, Male Adopting a healthy lifestyle and getting preventive care are important in promoting health and wellness. Ask your health care provider about: The right schedule for you to have regular tests and exams. Things you can do on your own to prevent diseases and keep yourself healthy. What should I know about diet, weight, and exercise? Eat a healthy diet  Eat a diet that includes plenty of vegetables, fruits, low-fat dairy products, and lean protein. Do not eat a lot of foods that are high in solid fats, added sugars, or sodium. Maintain a healthy weight Body mass index (BMI) is a measurement that can be used to identify possible weight problems. It estimates body fat based on height and weight. Your health care provider can help determine your BMI and help you achieve or maintain a healthy weight. Get regular exercise Get regular exercise. This is one of the most important things you can do for your health. Most adults should: Exercise for at least 150 minutes each week. The exercise should increase your heart rate and make you sweat (moderate-intensity exercise). Do strengthening exercises at least twice a week. This is in addition to the moderate-intensity exercise. Spend less time sitting. Even light physical activity can be beneficial. Watch cholesterol and blood lipids Have your blood tested for lipids and cholesterol at 64 years of age, then have this test every 5 years. You may need to have your  cholesterol levels checked more often if: Your lipid or cholesterol levels are high. You are older than 64 years of age. You are at high risk for heart disease. What should I know about cancer screening? Many types of cancers can be detected early and may often be prevented. Depending on your health history and family history, you may  need to have cancer screening at various ages. This may include screening for: Colorectal cancer. Prostate cancer. Skin cancer. Lung cancer. What should I know about heart disease, diabetes, and high blood pressure? Blood pressure and heart disease High blood pressure causes heart disease and increases the risk of stroke. This is more likely to develop in people who have high blood pressure readings or are overweight. Talk with your health care provider about your target blood pressure readings. Have your blood pressure checked: Every 3-5 years if you are 23-64 years of age. Every year if you are 74 years old or older. If you are between the ages of 7 and 3 and are a current or former smoker, ask your health care provider if you should have a one-time screening for abdominal aortic aneurysm (AAA). Diabetes Have regular diabetes screenings. This checks your fasting blood sugar level. Have the screening done: Once every three years after age 2 if you are at a normal weight and have a low risk for diabetes. More often and at a younger age if you are overweight or have a high risk for diabetes. What should I know about preventing infection? Hepatitis B If you have a higher risk for hepatitis B, you should be screened for this virus. Talk with your health care provider to find out if you are at risk for hepatitis B infection. Hepatitis C Blood testing is recommended for: Everyone born from [birth year] through 1967. Anyone with known risk factors for hepatitis C. Sexually transmitted infections (STIs) You should be screened each year for STIs, including gonorrhea and chlamydia, if: You are sexually active and are younger than 63 years of age. You are older than 63 years of age and your health care provider tells you that you are at risk for this type of infection. Your sexual activity has changed since you were last screened, and you are at increased risk for chlamydia or gonorrhea. Ask your health  care provider if you are at risk. Ask your health care provider about whether you are at high risk for HIV. Your health care provider may recommend a prescription medicine to help prevent HIV infection. If you choose to take medicine to prevent HIV, you should first get tested for HIV. You should then be tested every 3 months for as long as you are taking the medicine. Follow these instructions at home: Alcohol use Do not drink alcohol if your health care provider tells you not to drink. If you drink alcohol: Limit how much you have to 0-2 drinks a day. Know how much alcohol is in your drink. In the [place] one drink equals one 12 oz bottle of beer (355 mL), one 5 oz glass of wine (148 mL), or one 1 oz glass of hard liquor (44 mL). Lifestyle Do not use any products that contain nicotine or tobacco. These products include cigarettes, chewing tobacco, and vaping devices, such as e-cigarettes. If you need help quitting, ask your health care provider. Do not use street drugs. Do not share needles. Ask your health care provider for help if you need support or information about quitting drugs. General instructions Schedule regular  health, dental, and eye exams. Stay current with your vaccines. Tell your health care provider if: You often feel depressed. You have ever been abused or do not feel safe at home. Summary Adopting a healthy lifestyle and getting preventive care are important in promoting health and wellness. Follow your health care provider's instructions about healthy diet, exercising, and getting tested or screened for diseases. Follow your health care provider's instructions on monitoring your cholesterol and blood pressure. This information is not intended to replace advice given to you by your health care provider. Make sure you discuss any questions you have with your health care provider. Document Revised: 04/20/2021 Document Reviewed: 04/20/2021 Elsevier Patient Education  2023  [employer].

## 2023-04-27 ENCOUNTER — Other Ambulatory Visit: Payer: Self-pay | Admitting: *Deleted

## 2023-04-27 DIAGNOSIS — I10 Essential (primary) hypertension: Secondary | ICD-10-CM

## 2023-04-27 MED ORDER — LOSARTAN POTASSIUM-HCTZ 100-12.5 MG PO TABS
1.0000 | ORAL_TABLET | Freq: Every day | ORAL | 3 refills | Status: DC
Start: 2023-04-27 — End: 2023-10-27

## 2023-05-04 ENCOUNTER — Other Ambulatory Visit: Payer: Self-pay | Admitting: Cardiology

## 2023-05-04 DIAGNOSIS — I10 Essential (primary) hypertension: Secondary | ICD-10-CM

## 2023-07-20 ENCOUNTER — Other Ambulatory Visit: Payer: Self-pay | Admitting: *Deleted

## 2023-07-20 MED ORDER — NITROGLYCERIN 0.4 MG SL SUBL: 0.4000 mg | SUBLINGUAL_TABLET | SUBLINGUAL | 12 refills | Status: DC | PRN

## 2023-07-31 ENCOUNTER — Other Ambulatory Visit: Payer: Self-pay | Admitting: Cardiology

## 2023-08-30 NOTE — Progress Notes (Signed)
HPI: FU CAD.  Cardiac CTA May 2023 showed calcium score 1015 which was 95th percentile, severe stenosis in the right coronary artery, severe stenosis in the proximal second diagonal and otherwise mild disease.  Cardiac catheterization June 2023 showed 99% proximal RCA, 80% mid RCA, normal LV function.  Attempt at PCI of the right coronary artery was unsuccessful.  He has been treated medically since that time.  I saw the patient in November 2023 and reviewed his films with Dr. Clifton James.  He was not having chest pain and medical therapy again recommended.  Attempt at PCI of the RCA could be pursued if he develops chest pain.  Since last seen he does complain of fatigue.  He has dyspnea with more vigorous activities but not routine activities.  No exertional chest pain.  No syncope.  Current Outpatient Medications  Medication Sig Dispense Refill   amLODipine (NORVASC) 10 MG tablet TAKE 1 TABLET BY MOUTH EVERY DAY 90 tablet 3   atorvastatin (LIPITOR) 80 MG tablet TAKE 1 TABLET BY MOUTH EVERY DAY 90 tablet 3   calcium carbonate (TUMS - DOSED IN MG ELEMENTAL CALCIUM) 500 MG chewable tablet Chew 500 mg by mouth at bedtime as needed for indigestion or heartburn.     clopidogrel (PLAVIX) 75 MG tablet Take 1 tablet (75 mg total) by mouth daily. 90 tablet 3   losartan-hydrochlorothiazide (HYZAAR) 100-12.5 MG tablet Take 1 tablet by mouth daily. 90 tablet 3   metoprolol tartrate (LOPRESSOR) 25 MG tablet TAKE 1/2 TABLET TWICE A DAY BY MOUTH 90 tablet 0   nitroGLYCERIN (NITROSTAT) 0.4 MG SL tablet Place 1 tablet (0.4 mg total) under the tongue every 5 (five) minutes as needed. 25 tablet 12   No current facility-administered medications for this visit.     Past Medical History:  Diagnosis Date   Anxiety    Arteriovenous fistula of spinal cord vessels    Arthritis    Headache    Hyperlipidemia    Hypertension     Past Surgical History:  Procedure Laterality Date   angiogram spinal     ANTERIOR  CERVICAL DECOMP/DISCECTOMY FUSION N/A 03/23/2018   Procedure: ANTERIOR CERVICAL DECOMPRESSION/DISCECTOMY FUSION CERVICAL FOUR-FIVE, CERVICAL FIVE-SIX;  Surgeon: Venita Lick, MD;  Location: [facility] OR;  Service: Orthopedics;  Laterality: N/A;  3 hrs   CORONARY BALLOON ANGIOPLASTY N/A 05/27/2022   Procedure: CORONARY BALLOON ANGIOPLASTY;  Surgeon: Lennette Bihari, MD;  Location: [facility] INVASIVE CV LAB;  Service: Cardiovascular;  Laterality: N/A;   EYE SURGERY     removal of foreign body   LEFT HEART CATH AND CORONARY ANGIOGRAPHY N/A 05/27/2022   Procedure: LEFT HEART CATH AND CORONARY ANGIOGRAPHY;  Surgeon: Lennette Bihari, MD;  Location: [facility] INVASIVE CV [facility];  Service: Cardiovascular;  Laterality: N/A;   PERIPHERAL INTRAVASCULAR LITHOTRIPSY  05/27/2022   Procedure: INTRAVASCULAR LITHOTRIPSY;  Surgeon: Lennette Bihari, MD;  Location: [facility] INVASIVE CV LAB;  Service: Cardiovascular;;   VAGAL NERVE STIMULATOR REMOVAL  09/2015   Dr. Alvester Morin     Social History   Socioeconomic History   Marital status: Divorced    Spouse name: Not on file   Number of children: 1   Years of education: 12   Highest education level: GED or equivalent  Occupational History   Occupation: Disabled  Tobacco Use   Smoking status: Every Day    Current packs/day: 0.00    Average packs/day: 1 pack/day for 49.0 years (49.0 ttl pk-yrs)    Types: Cigarettes,  E-cigarettes    Start date: 12/13/1970    Last attempt to quit: 02/11/2018    Years since quitting: 5.5   Smokeless tobacco: Current   Tobacco comments:    1 pack a day   Vaping Use   Vaping status: Former  Substance and Sexual Activity   Alcohol use: Yes    Comment: 6-10 beers once/week   Drug use: No   Sexual activity: Yes    Partners: Female  Other Topics Concern   Not on file  Social History Narrative   Lives with his sister. He has one child. He enjoys watching television and browsing on the computer.   Social Determinants of Health   Financial Resource Strain: Low  Risk  (04/26/2023)   Overall Financial Resource Strain (CARDIA)    Difficulty of Paying Living Expenses: Not hard at all  Food Insecurity: No Food Insecurity (04/26/2023)   Hunger Vital Sign    Worried About Running Out of Food in the Last Year: Never true    Ran Out of Food in the Last Year: Never true  Transportation Needs: No Transportation Needs (04/26/2023)   PRAPARE - Administrator, Civil Service (Medical): No    Lack of Transportation (Non-Medical): No  Physical Activity: Inactive (04/26/2023)   Exercise Vital Sign    Days of Exercise per Week: 0 days    Minutes of Exercise per Session: 0 min  Stress: No Stress Concern Present (04/26/2023)   [employer] of Occupational Health - Occupational Stress Questionnaire    Feeling of Stress : Not at all  Social Connections: Socially Isolated (04/26/2023)   Social Connection and Isolation Panel [NHANES]    Frequency of Communication with Friends and Family: More than three times a week    Frequency of Social Gatherings with Friends and Family: More than three times a week    Attends Religious Services: Never    Database administrator or Organizations: No    Attends Banker Meetings: Never    Marital Status: Divorced  Catering manager Violence: Not At Risk (04/26/2023)   Humiliation, Afraid, Rape, and Kick questionnaire    Fear of Current or Ex-Partner: No    Emotionally Abused: No    Physically Abused: No    Sexually Abused: No    Family History  Problem Relation Age of Onset   Heart attack Mother    Heart disease Mother 40   Hypertension Mother    Cancer Father    Stroke Maternal Aunt     ROS: no fevers or chills, productive cough, hemoptysis, dysphasia, odynophagia, melena, hematochezia, dysuria, hematuria, rash, seizure activity, orthopnea, PND, pedal edema, claudication. Remaining systems are negative.  Physical Exam: Well-developed well-nourished in no acute distress.  Skin is warm and dry.   HEENT is normal.  Neck is supple.  Chest is clear to auscultation with normal expansion.  Cardiovascular exam is regular rate and rhythm.  Abdominal exam nontender or distended. No masses palpated. Extremities show no edema. neuro grossly intact  EKG Interpretation Date/Time:  Monday September 12 2023 11:14:23 EDT Ventricular Rate:  59 PR Interval:  188 QRS Duration:  84 QT Interval:  378 QTC Calculation: 374 R Axis:   53  Text Interpretation: Sinus bradycardia Cannot rule out septal MI Confirmed by Olga Millers ([id number]) on 09/12/2023 11:17:20 AM    A/P  1 coronary artery disease-plan to continue aspirin and statin.  Will discontinue Plavix.  Continue amlodipine and Toprol.  We will plan  to consider reattempt at PCI of the right coronary artery if he develops chest pain despite medical therapy.  2 hypertension-patient's blood pressure is controlled today.  Continue present medical regimen.  3 hyperlipidemia-continue statin.  4 tobacco abuse-patient counseled on discontinuing.  Olga Millers, MD

## 2023-08-31 ENCOUNTER — Telehealth: Payer: Self-pay | Admitting: Family Medicine

## 2023-08-31 NOTE — Telephone Encounter (Signed)
Patient called requesting an exemption note from Jury duty on 09/21/23.

## 2023-09-05 NOTE — Telephone Encounter (Signed)
Letter printed and pt advised that this will be up front for pick up.

## 2023-09-12 ENCOUNTER — Encounter: Payer: Self-pay | Admitting: Cardiology

## 2023-09-12 ENCOUNTER — Ambulatory Visit (INDEPENDENT_AMBULATORY_CARE_PROVIDER_SITE_OTHER): Payer: No Typology Code available for payment source | Admitting: Cardiology

## 2023-09-12 VITALS — BP 129/79 | HR 59 | Ht 64.0 in | Wt 145.0 lb

## 2023-09-12 DIAGNOSIS — I2510 Atherosclerotic heart disease of native coronary artery without angina pectoris: Secondary | ICD-10-CM

## 2023-09-12 DIAGNOSIS — Z720 Tobacco use: Secondary | ICD-10-CM | POA: Diagnosis not present

## 2023-09-12 DIAGNOSIS — Z09 Encounter for follow-up examination after completed treatment for conditions other than malignant neoplasm: Secondary | ICD-10-CM

## 2023-09-12 DIAGNOSIS — I10 Essential (primary) hypertension: Secondary | ICD-10-CM | POA: Diagnosis not present

## 2023-09-12 DIAGNOSIS — E785 Hyperlipidemia, unspecified: Secondary | ICD-10-CM

## 2023-09-12 NOTE — Patient Instructions (Signed)
Medication Instructions:   STOP PLAVIX   *If you need a refill on your cardiac medications before your next appointment, please call your pharmacy*   Follow-Up: At [facility], you and your health needs are our priority.  As part of our continuing mission to provide you with exceptional heart care, we have created designated Provider Care Teams.  These Care Teams include your primary Cardiologist (physician) and Advanced Practice Providers (APPs -  Physician Assistants and Nurse Practitioners) who all work together to provide you with the care you need, when you need it.  We recommend signing up for the patient portal called "MyChart".  Sign up information is provided on this After Visit Summary.  MyChart is used to connect with patients for Virtual Visits (Telemedicine).  Patients are able to view lab/test results, encounter notes, upcoming appointments, etc.  Non-urgent messages can be sent to your provider as well.   To learn more about what you can do with MyChart, go to [email or web address].    Your next appointment:   12 month(s)  Provider:   Olga Millers, MD

## 2023-09-17 IMAGING — CT CT HEART MORP W/ CTA COR W/ SCORE W/ CA W/CM &/OR W/O CM
4 of 7 series · 8 of 20 positions shown, 9 images · IV contrast (APPLIED)
Comparison: Chest two views 11/18/2011
COMPARISON: Chest two views 11/18/2011

Addendum:
EXAM:
OVER-READ INTERPRETATION  CT CHEST

The following report is an over-read performed by radiologist Dr.
Khuram Carmon [[email or web address]] on 05/12/2022. This over-read
does not include interpretation of cardiac or coronary anatomy or
pathology. The coronary calcium score/coronary CTA interpretation by
the cardiologist is attached.
HISTORY: Chest pain
Cardiac/Coronary  CT
TECHNIQUE: The patient was scanned on a Siemens Force scanner.
PROTOCOL: A 120 kV prospective scan was triggered in the descending thoracic
aorta at 111 HU's. Axial non-contrast 3 mm slices were carried out
through the heart. The data set was analyzed on a dedicated work
station and scored using the Agatston method. Gantry rotation speed
was 250 msecs and collimation was .6 mm. Beta blockade and 0.8 mg of
sl NTG was given. The 3D data set was reconstructed in 5% intervals
of the 35-75 % of the R-R cycle. Systolic and diastolic phases were
analyzed on a dedicated work station using MPR, MIP and VRT modes.
The patient received 100mL OMNIPAQUE IOHEXOL 350 MG/ML SOLN
contrast.

[Series 6: ts diast sharp · axial · 0.39mm/px · z∈[-174,-134]mm · 2 of 301 slices shown]
[im 101/301  lung]
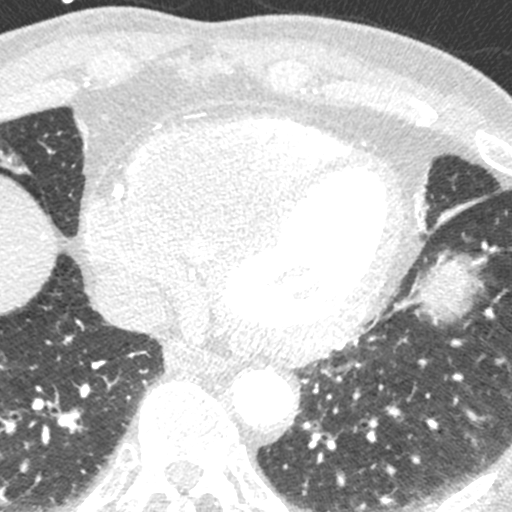
[im 201/301  lung]
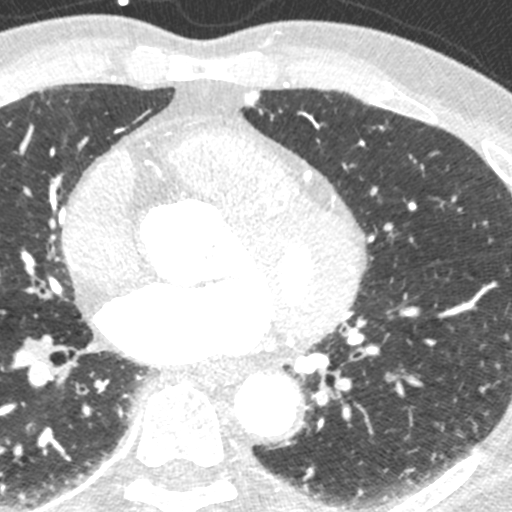

[Series 7: ts syst sharp · axial · 0.39mm/px · z∈[-174,-134]mm · 2 of 301 slices shown]
[im 101/301  lung]
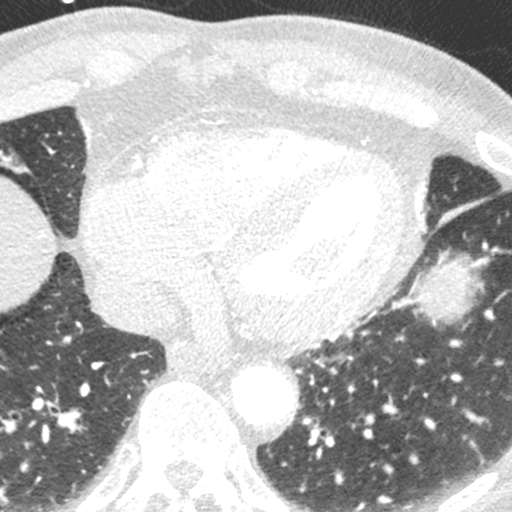
[im 201/301  lung]
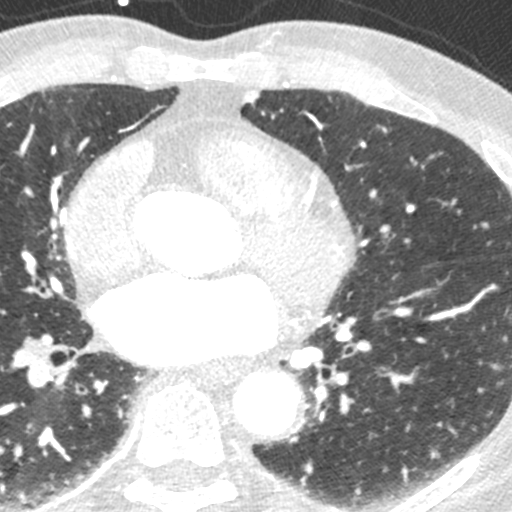

[Series 8: best syst · axial · 0.39mm/px · z∈[-174,-134]mm · 2 of 301 slices shown, 3 images]
[im 101/301  vessel]
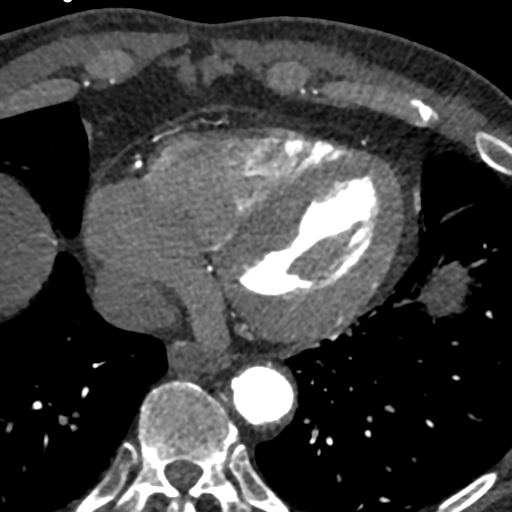
[im 101/301  lung]
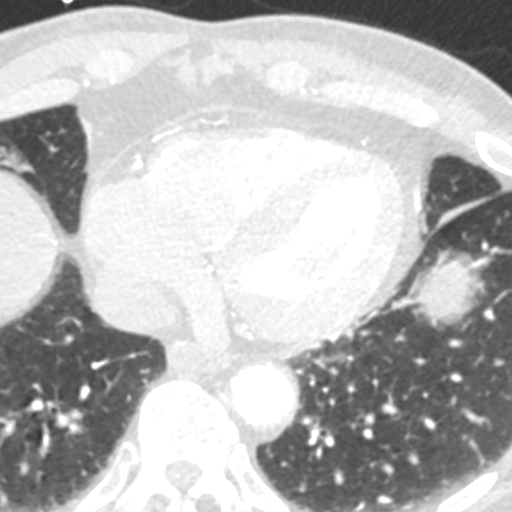
[im 201/301  vessel]
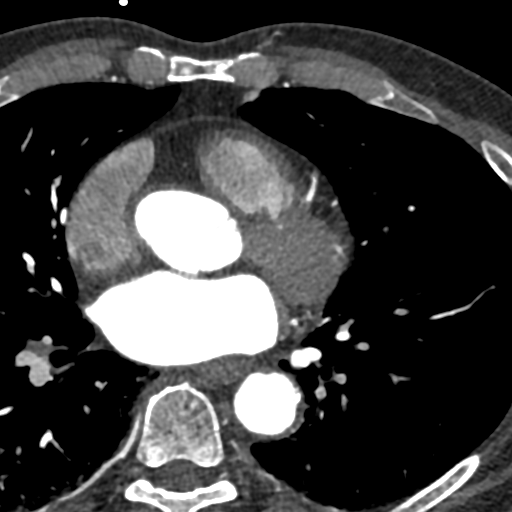

[Series 9: best diast · axial · 0.39mm/px · z∈[-174,-134]mm · 2 of 301 slices shown]
[im 101/301  vessel]
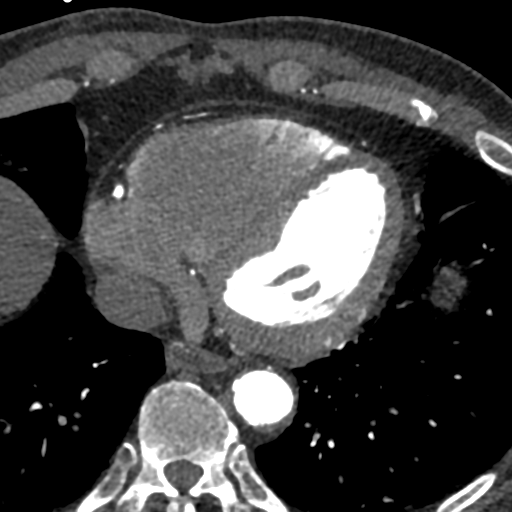
[im 201/301  vessel]
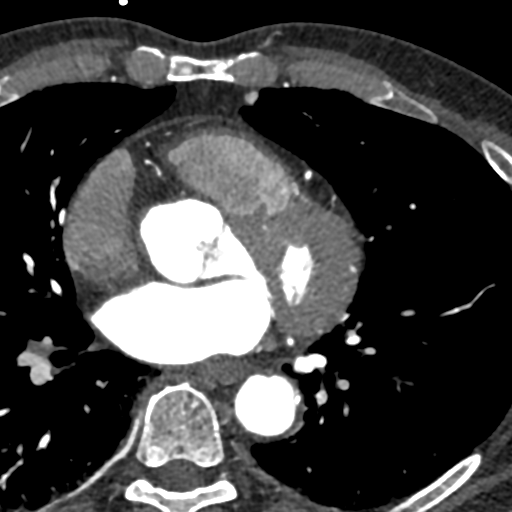

[8 of 20 positions shown; findings below may reference images not displayed]

FINDINGS: Cardiovascular: A small portion of the pulmonary arteries is imaged
without a definite large central pulmonary embolism visualized. The
ascending aorta measures up to 3.4 cm in caliber, nonaneurysmal.

Mediastinum/Nodes: There are no enlarged lymph nodes within the
visualized mediastinum.

Lungs/Pleura: Mild curvilinear subsegmental atelectasis versus
scarring within the middle lobe lingula, and medial left lower lobe.
No pleural effusion or pneumothorax.

Upper abdomen: Small sliding hiatal hernia.

Musculoskeletal/Chest wall: No chest wall mass or suspicious osseous
findings within the visualized chest.
IMPRESSION: Small sliding hiatal hernia. Otherwise, no significant extracardiac
findings within the visualized chest.
FINDINGS: Image quality: Good

Noise artifact is: Limited

Coronary calcium score is 1713, which places the patient in the 95th
percentile for age and sex matched control.

Coronary arteries: Normal coronary origins.  Right dominance.

Right Coronary Artery: Mild mixed atherosclerotic plaque in the
proximal and distal RCA, 25-49% stenosis. Severe mixed
atherosclerotic plaque in the mid RCA with calcium and low
attenuation plaque suggesting high risk plaque characteristics,
70-99% stenosis. Patent PDA and PLA without detectable plaque or
stenosis

Left Main Coronary Artery: No detectable plaque or stenosis.

Left Anterior Descending Coronary Artery: Mild atherosclerotic
plaque in the proximal LAD, 25-49% stenosis. At the level of the
bifurcation of the first diagonal artery there is a mixed
atherosclerotic plaque with mild vessel stenosis 25-49%, however
with positive remodeling and spotty calcifications, suggesting high
risk plaque characteristics. At the level of the second diagonal
there is a plaque with similar characteristics with minimal vessel
stenosis, <25%. First diagonal branch has no significant plaque or
stenosis. Severe mixed atherosclerotic plaque in the proximal second
diagonal artery, 70-99% stenosis.

Left Circumflex Artery: Mild mixed atherosclerotic plaque in the
proximal LCx and scattered in OM2, 25-49% stenosis.

Aorta: Normal size, 37 mm at the mid ascending aorta (level of the
PA bifurcation) measured double oblique. Severe aortic
atherosclerosis.

Sinus of valsalva: 33 x 34 x 35 mm

Aortic Valve: No calcifications.

Other findings:

Normal variant pulmonary vein drainage into the left atrium.

Normal left atrial appendage without thrombus.

Normal size of the pulmonary artery.
IMPRESSION: 1. Severe CAD in mid RCA, CADRADS = 4V. CT FFR will be performed and
reported separately.

2. Vulnerable plaque characteristics in the proximal LAD with mild
vessel stenosis.

3. Coronary calcium score is 1713, which places the patient in the
95th percentile for age and sex matched control.

4. Normal coronary origins with right dominance.

5.  Patent foramen ovale with left to right shunt.

6.  Aortic atherosclerosis.

*** End of Addendum ***
EXAM:
OVER-READ INTERPRETATION  CT CHEST

The following report is an over-read performed by radiologist Dr.
Khuram Carmon [REDACTED] on 05/12/2022. This over-read
does not include interpretation of cardiac or coronary anatomy or
pathology. The coronary calcium score/coronary CTA interpretation by
the cardiologist is attached.
FINDINGS: Cardiovascular: A small portion of the pulmonary arteries is imaged
without a definite large central pulmonary embolism visualized. The
ascending aorta measures up to 3.4 cm in caliber, nonaneurysmal.

Mediastinum/Nodes: There are no enlarged lymph nodes within the
visualized mediastinum.

Lungs/Pleura: Mild curvilinear subsegmental atelectasis versus
scarring within the middle lobe lingula, and medial left lower lobe.
No pleural effusion or pneumothorax.

Upper abdomen: Small sliding hiatal hernia.

Musculoskeletal/Chest wall: No chest wall mass or suspicious osseous
findings within the visualized chest.
IMPRESSION: Small sliding hiatal hernia. Otherwise, no significant extracardiac
findings within the visualized chest.

## 2023-10-09 ENCOUNTER — Other Ambulatory Visit: Payer: Self-pay | Admitting: Cardiology

## 2023-10-27 ENCOUNTER — Ambulatory Visit (INDEPENDENT_AMBULATORY_CARE_PROVIDER_SITE_OTHER): Payer: No Typology Code available for payment source | Admitting: Family Medicine

## 2023-10-27 ENCOUNTER — Encounter: Payer: Self-pay | Admitting: Family Medicine

## 2023-10-27 VITALS — BP 112/62 | HR 62 | Ht 64.0 in | Wt 145.0 lb

## 2023-10-27 DIAGNOSIS — Z716 Tobacco abuse counseling: Secondary | ICD-10-CM | POA: Diagnosis not present

## 2023-10-27 DIAGNOSIS — R5383 Other fatigue: Secondary | ICD-10-CM

## 2023-10-27 DIAGNOSIS — Z23 Encounter for immunization: Secondary | ICD-10-CM

## 2023-10-27 DIAGNOSIS — I2510 Atherosclerotic heart disease of native coronary artery without angina pectoris: Secondary | ICD-10-CM

## 2023-10-27 DIAGNOSIS — I10 Essential (primary) hypertension: Secondary | ICD-10-CM | POA: Diagnosis not present

## 2023-10-27 MED ORDER — LOSARTAN POTASSIUM-HCTZ 100-12.5 MG PO TABS: 1.0000 | ORAL_TABLET | Freq: Every day | ORAL | 3 refills | Status: DC

## 2023-10-27 NOTE — Progress Notes (Signed)
Established Patient Office Visit  Subjective   Patient ID: Gerald Rogers, male    DOB: [birth year]  Age: 62 y.o. MRN: [id number]  Chief Complaint  Patient presents with   Hypertension    HPI  Hypertension- Pt denies chest pain, SOB, dizziness, or heart palpitations.  Taking meds as directed w/o problems.  Denies medication side effects.    F/U CAD -only followed up with Dr. Jens Som for elevated cardiac calcium score which was in the 95th percentile he is on 80 mg of atorvastatin.  Continue to smoke 1 pack a day.  They did discontinue his Plavix and had him continue his amlodipine and metoprolol.  Does report he is just felt fatigued.  He thought initially maybe it was the Plavix but has been off that for couple of weeks.  His sleep schedule is also off right now sometimes he only sleeps 4 to 6 hours at night and then tends to nap.  Nothing specific he has not noticed any blood in the urine or stool.  He does not have any dietary restrictions.  No thyroid disorder.  No prior history of anemia.    ROS    Objective:     BP 112/62   Pulse 62   Ht 5\' 4"  (1.626 m)   Wt 145 lb (65.8 kg)   SpO2 95%   BMI 24.89 kg/m    Physical Exam Vitals and nursing note reviewed.  Constitutional:      Appearance: Normal appearance.  HENT:     Head: Normocephalic and atraumatic.  Eyes:     Conjunctiva/sclera: Conjunctivae normal.  Cardiovascular:     Rate and Rhythm: Normal rate and regular rhythm.  Pulmonary:     Effort: Pulmonary effort is normal.     Breath sounds: Normal breath sounds.  Skin:    General: Skin is warm and dry.  Neurological:     Mental Status: He is alert.  Psychiatric:        Mood and Affect: Mood normal.     No results found for any visits on 10/27/23.    The ASCVD Risk score (Arnett DK, et al., 2019) failed to calculate for the following reasons:   The valid total cholesterol range is 130 to 320 mg/dL    Assessment & Plan:   Problem List Items  Addressed This Visit       Cardiovascular and Mediastinum   Coronary artery disease involving native heart without angina pectoris - Primary    Continue beta-blocker and maintaining good blood pressure control and high-dose statin.      Relevant Medications   losartan-hydrochlorothiazide (HYZAAR) 100-12.5 MG tablet   Other Relevant Orders   CMP14+EGFR   TSH   CBC with Differential/Platelet   Urinalysis, Routine w reflex microscopic   Lipid panel   Benign essential hypertension    Pressure looks perfect today continue current regimen due for updated labs.  Follow-up 6 months.      Relevant Medications   losartan-hydrochlorothiazide (HYZAAR) 100-12.5 MG tablet   Other Relevant Orders   CMP14+EGFR   TSH   CBC with Differential/Platelet   Urinalysis, Routine w reflex microscopic   Lipid panel     Other   Tobacco abuse counseling    He says he knows he needs to quit but is just not mentally in a state of change quite yet did offer to be of assistance to discuss options to help make him successful if he gets to a place where he  really is interested in quitting.  He understands the health benefits of quitting and the consequences of not.      Other Visit Diagnoses     Encounter for immunization       Relevant Orders   Flu vaccine trivalent PF, 6mos and older(Flulaval,Afluria,Fluarix,Fluzone) (Completed)   Other fatigue       Relevant Medications   losartan-hydrochlorothiazide (HYZAAR) 100-12.5 MG tablet   Other Relevant Orders   CMP14+EGFR   TSH   CBC with Differential/Platelet   Urinalysis, Routine w reflex microscopic   Lipid panel      Fatigue-will do some additional labs including CBC and thyroid check.  If symptoms persist then please let us know.  Return in about 6 months (around 04/25/2024) for Hypertension.    Nani Gasser, MD

## 2023-10-27 NOTE — Assessment & Plan Note (Signed)
He says he knows he needs to quit but is just not mentally in a state of change quite yet did offer to be of assistance to discuss options to help make him successful if he gets to a place where he really is interested in quitting.  He understands the health benefits of quitting and the consequences of not.

## 2023-10-27 NOTE — Assessment & Plan Note (Signed)
Continue beta-blocker and maintaining good blood pressure control and high-dose statin.

## 2023-10-27 NOTE — Patient Instructions (Signed)
He is get your shingles vaccine done at the local pharmacy.

## 2023-10-27 NOTE — Assessment & Plan Note (Signed)
Pressure looks perfect today continue current regimen due for updated labs.  Follow-up 6 months.

## 2023-10-28 LAB — URINALYSIS, ROUTINE W REFLEX MICROSCOPIC
Bilirubin, UA: NEGATIVE
Glucose, UA: NEGATIVE
Ketones, UA: NEGATIVE
Leukocytes,UA: NEGATIVE
Nitrite, UA: NEGATIVE
Protein,UA: NEGATIVE
RBC, UA: NEGATIVE
Specific Gravity, UA: 1.01 (ref 1.005–1.030)
Urobilinogen, Ur: 0.2 mg/dL (ref 0.2–1.0)
pH, UA: 6 (ref 5.0–7.5)

## 2023-10-28 LAB — LIPID PANEL
Chol/HDL Ratio: 3.5 ratio (ref 0.0–5.0)
Cholesterol, Total: 144 mg/dL (ref 100–199)
HDL: 41 mg/dL (ref >39)
LDL Chol Calc (NIH): 69 mg/dL (ref 0–99)
Triglycerides: 206 mg/dL — ABNORMAL HIGH (ref 0–149)
VLDL Cholesterol Cal: 34 mg/dL (ref 5–40)

## 2023-10-28 LAB — CMP14+EGFR
ALT: 32 [IU]/L (ref 0–44)
AST: 23 [IU]/L (ref 0–40)
Albumin: 4.6 g/dL (ref 3.9–4.9)
Alkaline Phosphatase: 89 [IU]/L (ref 44–121)
BUN/Creatinine Ratio: 15 (ref 10–24)
BUN: 13 mg/dL (ref 8–27)
Bilirubin Total: 0.4 mg/dL (ref 0.0–1.2)
CO2: 27 mmol/L (ref 20–29)
Calcium: 10.9 mg/dL — ABNORMAL HIGH (ref 8.6–10.2)
Chloride: 90 mmol/L — ABNORMAL LOW (ref 96–106)
Creatinine, Ser: 0.85 mg/dL (ref 0.76–1.27)
Globulin, Total: 3.1 g/dL (ref 1.5–4.5)
Glucose: 78 mg/dL (ref 70–99)
Potassium: 5 mmol/L (ref 3.5–5.2)
Sodium: 132 mmol/L — ABNORMAL LOW (ref 134–144)
Total Protein: 7.7 g/dL (ref 6.0–8.5)
eGFR: 97 mL/min/{1.73_m2} (ref >59)

## 2023-10-28 LAB — CBC WITH DIFFERENTIAL/PLATELET
Basophils Absolute: 0.1 10*3/uL (ref 0.0–0.2)
Basos: 1 %
EOS (ABSOLUTE): 0.2 10*3/uL (ref 0.0–0.4)
Eos: 2 %
Hematocrit: 47.9 % (ref 37.5–51.0)
Hemoglobin: 16 g/dL (ref 13.0–17.7)
Immature Grans (Abs): 0.1 10*3/uL (ref 0.0–0.1)
Immature Granulocytes: 1 %
Lymphocytes Absolute: 2.4 10*3/uL (ref 0.7–3.1)
Lymphs: 17 %
MCH: 30.9 pg (ref 26.6–33.0)
MCHC: 33.4 g/dL (ref 31.5–35.7)
MCV: 93 fL (ref 79–97)
Monocytes Absolute: 1.6 10*3/uL — ABNORMAL HIGH (ref 0.1–0.9)
Monocytes: 11 %
Neutrophils Absolute: 9.8 10*3/uL — ABNORMAL HIGH (ref 1.4–7.0)
Neutrophils: 68 %
Platelets: 326 10*3/uL (ref 150–450)
RBC: 5.17 x10E6/uL (ref 4.14–5.80)
RDW: 13.2 % (ref 11.6–15.4)
WBC: 14.1 10*3/uL — ABNORMAL HIGH (ref 3.4–10.8)

## 2023-10-28 LAB — TSH: TSH: 3.85 u[IU]/mL (ref 0.450–4.500)

## 2023-10-28 NOTE — Progress Notes (Signed)
Call patient: Sodium level has dropped again similar to a year ago when you were having issues with it being low as well as your chloride.  Calcium is also a little elevated similar to what it has been over the last year.  Any recent alcohol intake?  Sometimes it can drop sodium levels so just wanted to ask.  Your blood count shows just a slight elevation in your white blood cells.  But no anemia.  I know you mentioned you been feeling tired lately any viruses or colds recently?  Total cholesterol and LDL look great.  Your thyroid level looks good.  Urine analysis is normal.

## 2023-10-31 ENCOUNTER — Other Ambulatory Visit: Payer: Self-pay | Admitting: Cardiology

## 2024-01-08 ENCOUNTER — Other Ambulatory Visit: Payer: Self-pay | Admitting: Cardiology

## 2024-02-02 ENCOUNTER — Telehealth: Payer: Self-pay | Admitting: *Deleted

## 2024-02-02 MED ORDER — ATORVASTATIN CALCIUM 80 MG PO TABS: 80.0000 mg | ORAL_TABLET | Freq: Every day | ORAL | 3 refills | Status: AC

## 2024-02-02 NOTE — Telephone Encounter (Signed)
-----   Message from Olga Millers sent at 02/02/2024 12:55 PM EST ----- Regarding: FW: Atovastatin refill Refill Olga Millers ----- Message ----- From: Deno Etienne, CMA Sent: 02/02/2024  12:55 PM EST To: Lewayne Bunting, MD Subject: Atovastatin refill                             Refill requested for Atorvastatin ----- Message ----- From: Nyra Jabs Sent: 01/30/2024   3:50 PM EST To: Deno Etienne, CMA

## 2024-04-25 ENCOUNTER — Ambulatory Visit (INDEPENDENT_AMBULATORY_CARE_PROVIDER_SITE_OTHER): Payer: No Typology Code available for payment source | Admitting: Family Medicine

## 2024-04-25 ENCOUNTER — Encounter: Payer: Self-pay | Admitting: Family Medicine

## 2024-04-25 VITALS — BP 116/74 | HR 60 | Ht 64.0 in | Wt 148.0 lb

## 2024-04-25 DIAGNOSIS — G959 Disease of spinal cord, unspecified: Secondary | ICD-10-CM

## 2024-04-25 DIAGNOSIS — I10 Essential (primary) hypertension: Secondary | ICD-10-CM | POA: Diagnosis not present

## 2024-04-25 DIAGNOSIS — I2510 Atherosclerotic heart disease of native coronary artery without angina pectoris: Secondary | ICD-10-CM | POA: Diagnosis not present

## 2024-04-25 DIAGNOSIS — F17200 Nicotine dependence, unspecified, uncomplicated: Secondary | ICD-10-CM

## 2024-04-25 NOTE — Progress Notes (Signed)
   Established Patient Office Visit  Subjective  Patient ID: Gerald Rogers, male    DOB: May 20, 1961  Age: 63 y.o. MRN: [id number]  Chief Complaint  Patient presents with   Hypertension    HPI  Hypertension- Pt denies chest pain, SOB, dizziness, or heart palpitations.  Taking meds as directed w/o problems.  Denies medication side effects.  No swelling he is doing well Rogers no problems with medications or side effects.  He is still smoking.    ROS    Objective:     BP 116/74   Pulse 60   Ht 5\' 4"  (1.626 m)   Wt 148 lb 0.6 oz (67.2 kg)   SpO2 94%   BMI 25.41 kg/m     Physical Exam Vitals and nursing note reviewed.  Constitutional:      Appearance: Normal appearance.  HENT:     Head: Normocephalic and atraumatic.  Eyes:     Conjunctiva/sclera: Conjunctivae normal.  Cardiovascular:     Rate and Rhythm: Normal rate and regular rhythm.  Pulmonary:     Effort: Pulmonary effort is normal.     Breath sounds: Normal breath sounds.  Skin:    General: Skin is warm and dry.  Neurological:     Mental Status: He is alert.  Psychiatric:        Mood and Affect: Mood normal.      No results found for any visits on 04/25/24.     The 10-year ASCVD risk score (Arnett DK, et al., 2019) is: 14.6%    Assessment & Plan:   Problem List Items Addressed This Visit       Cardiovascular and Mediastinum   Coronary artery disease involving native heart without angina pectoris   ON daily  statin      Benign essential hypertension - Primary   Well controlled. Continue current regimen. Follow up in  6 mo       Relevant Orders   CMP14+EGFR     Nervous and Auditory   Myelopathy (HCC)   He still using a cane,  but doesn't need the Rollator anymore. Doing well.         Other   Smoker   Discussed need for lung cancer screening.  Also provided him some additional information and encouraged him to think about it I am happy to place referral if he is okay with it.  He  said he will let me know.       Return in about 6 months (around 10/26/2024) for Hypertension.    Duaine German, MD

## 2024-04-25 NOTE — Assessment & Plan Note (Addendum)
 ON daily  statin

## 2024-04-25 NOTE — Assessment & Plan Note (Signed)
 Discussed need for lung cancer screening.  Also provided him some additional information and encouraged him to think about it I am happy to place referral if he is okay with it.  He said he will let me know.

## 2024-04-25 NOTE — Assessment & Plan Note (Signed)
 He still using a cane,  but doesn't need the Rollator anymore. Doing well.

## 2024-04-25 NOTE — Assessment & Plan Note (Addendum)
 Well controlled. Continue current regimen. Follow up in  6 mo

## 2024-04-26 ENCOUNTER — Ambulatory Visit: Payer: Self-pay | Admitting: Family Medicine

## 2024-04-26 LAB — CMP14+EGFR
ALT: 24 IU/L (ref 0–44)
AST: 17 IU/L (ref 0–40)
Albumin: 4.5 g/dL (ref 3.9–4.9)
Alkaline Phosphatase: 81 IU/L (ref 44–121)
BUN/Creatinine Ratio: 22 (ref 10–24)
BUN: 17 mg/dL (ref 8–27)
Bilirubin Total: 0.4 mg/dL (ref 0.0–1.2)
CO2: 26 mmol/L (ref 20–29)
Calcium: 10.1 mg/dL (ref 8.6–10.2)
Chloride: 93 mmol/L — ABNORMAL LOW (ref 96–106)
Creatinine, Ser: 0.79 mg/dL (ref 0.76–1.27)
Globulin, Total: 2.6 g/dL (ref 1.5–4.5)
Glucose: 89 mg/dL (ref 70–99)
Potassium: 4.4 mmol/L (ref 3.5–5.2)
Sodium: 132 mmol/L — ABNORMAL LOW (ref 134–144)
Total Protein: 7.1 g/dL (ref 6.0–8.5)
eGFR: 99 mL/min/{1.73_m2} (ref >59)

## 2024-04-26 NOTE — Progress Notes (Signed)
 Call patient: Sodium still little in the borderline low range but liver function is normal.  Change to current regimen.  Otherwise stable.  We had docked about potentially doing the lung cancer screening.  If he is okay to do it just let me know and I will place the order if he is still thinking about it then that is fine.

## 2024-04-30 ENCOUNTER — Other Ambulatory Visit: Payer: Self-pay | Admitting: Cardiology

## 2024-04-30 DIAGNOSIS — I10 Essential (primary) hypertension: Secondary | ICD-10-CM

## 2024-05-03 ENCOUNTER — Ambulatory Visit (INDEPENDENT_AMBULATORY_CARE_PROVIDER_SITE_OTHER)

## 2024-05-03 VITALS — Ht 64.0 in | Wt 148.0 lb

## 2024-05-03 DIAGNOSIS — Z0000 Encounter for general adult medical examination without abnormal findings: Secondary | ICD-10-CM | POA: Diagnosis not present

## 2024-05-03 NOTE — Progress Notes (Signed)
 Subjective:   Gerald Rogers is a 63 y.o. male who presents for Medicare Annual/Subsequent preventive examination.  Visit Complete: Virtual I connected with  Gerald Rogers on 05/03/24 by a audio enabled telemedicine application and verified that I am speaking with the correct person using two identifiers.  Patient Location: Home  Provider Location: Office/Clinic  I discussed the limitations of evaluation and management by telemedicine. The patient expressed understanding and agreed to proceed.  Vital Signs: Because this visit was a virtual/telehealth visit, some criteria may be missing or patient reported. Any vitals not documented were not able to be obtained and vitals that have been documented are patient reported.  Patient Medicare AWV questionnaire was completed by the patient on 04/25/2024; I have confirmed that all information answered by patient is correct and no changes since this date.  Cardiac Risk Factors include: male gender;advanced age (>76men, >84 women);smoking/ tobacco exposure;dyslipidemia;hypertension;family history of premature cardiovascular disease;sedentary lifestyle     Objective:    Today's Vitals   05/03/24 0805  Weight: 148 lb (67.1 kg)  Height: 5\' 4"  (1.626 m)   Body mass index is 25.4 kg/m.     05/03/2024    8:13 AM 04/26/2023    8:05 AM 05/28/2022    8:05 AM 05/27/2022    7:30 AM 03/23/2018    5:45 PM 03/23/2018    8:39 AM 03/20/2018   10:39 AM  Advanced Directives  Does Patient Have a Medical Advance Directive? No No  No No No No  Would patient like information on creating a medical advance directive? No - Patient declined No - Patient declined No - Patient declined  No - Patient declined No - Patient declined Yes (MAU/Ambulatory/Procedural Areas - Information given)    Current Medications (verified) Outpatient Encounter Medications as of 05/03/2024  Medication Sig   amLODipine  (NORVASC ) 10 MG tablet TAKE 1 TABLET BY MOUTH EVERY DAY    atorvastatin  (LIPITOR ) 80 MG tablet Take 1 tablet (80 mg total) by mouth daily.   calcium  carbonate (TUMS - DOSED IN MG ELEMENTAL CALCIUM ) 500 MG chewable tablet Chew 500 mg by mouth at bedtime as needed for indigestion or heartburn.   losartan -hydrochlorothiazide  (HYZAAR) 100-12.5 MG tablet Take 1 tablet by mouth daily.   metoprolol  tartrate (LOPRESSOR ) 25 MG tablet TAKE 1/2 TABLET TWICE A DAY BY MOUTH   nitroGLYCERIN  (NITROSTAT ) 0.4 MG SL tablet Place 1 tablet (0.4 mg total) under the tongue every 5 (five) minutes as needed.   No facility-administered encounter medications on file as of 05/03/2024.    Allergies (verified) Cymbalta [duloxetine hcl] and Klonopin [clonazepam]   History: Past Medical History:  Diagnosis Date   Anxiety    Arteriovenous fistula of spinal cord vessels    Arthritis    Headache    Hyperlipidemia    Hypertension    Past Surgical History:  Procedure Laterality Date   angiogram spinal     ANTERIOR CERVICAL DECOMP/DISCECTOMY FUSION N/A 03/23/2018   Procedure: ANTERIOR CERVICAL DECOMPRESSION/DISCECTOMY FUSION CERVICAL FOUR-FIVE, CERVICAL FIVE-SIX;  Surgeon: Mort Ards, MD;  Location: [facility] OR;  Service: Orthopedics;  Laterality: N/A;  3 hrs   CORONARY BALLOON ANGIOPLASTY N/A 05/27/2022   Procedure: CORONARY BALLOON ANGIOPLASTY;  Surgeon: Millicent Ally, MD;  Location: [facility] INVASIVE CV LAB;  Service: Cardiovascular;  Laterality: N/A;   EYE SURGERY     removal of foreign body   LEFT HEART CATH AND CORONARY ANGIOGRAPHY N/A 05/27/2022   Procedure: LEFT HEART CATH AND CORONARY ANGIOGRAPHY;  Surgeon:  Millicent Ally, MD;  Location: [facility] INVASIVE CV LAB;  Service: Cardiovascular;  Laterality: N/A;   PERIPHERAL INTRAVASCULAR LITHOTRIPSY  05/27/2022   Procedure: INTRAVASCULAR LITHOTRIPSY;  Surgeon: Millicent Ally, MD;  Location: [facility] INVASIVE CV LAB;  Service: Cardiovascular;;   VAGAL NERVE STIMULATOR REMOVAL  09/2015   Dr. Parke Boll    Family History  Problem Relation Age of  Onset   Heart attack Mother    Heart disease Mother 35   Hypertension Mother    Cancer Father    Stroke Maternal Aunt    Social History   Socioeconomic History   Marital status: Divorced    Spouse name: Not on file   Number of children: 1   Years of education: 12   Highest education level: GED or equivalent  Occupational History   Occupation: Disabled  Tobacco Use   Smoking status: Every Day    Current packs/day: 1.00    Average packs/day: 1 pack/day for 53.6 years (53.6 ttl pk-yrs)    Types: Cigarettes, E-cigarettes    Start date: 12/13/1970    Last attempt to quit: 02/11/2018   Smokeless tobacco: Current   Tobacco comments:    1 pack a day   Vaping Use   Vaping status: Former  Substance and Sexual Activity   Alcohol use: Yes    Comment: 6-10 beers once/week   Drug use: No   Sexual activity: Yes    Partners: Female  Other Topics Concern   Not on file  Social History Narrative   Lives with his sister. He has one child. He enjoys watching television and browsing on the computer.   Social Drivers of Corporate investment banker Strain: Low Risk  (05/03/2024)   Overall Financial Resource Strain (CARDIA)    Difficulty of Paying Living Expenses: Not hard at all  Food Insecurity: No Food Insecurity (05/03/2024)   Hunger Vital Sign    Worried About Running Out of Food in the Last Year: Never true    Ran Out of Food in the Last Year: Never true  Transportation Needs: No Transportation Needs (05/03/2024)   PRAPARE - Administrator, Civil Service (Medical): No    Lack of Transportation (Non-Medical): No  Physical Activity: Insufficiently Active (05/03/2024)   Exercise Vital Sign    Days of Exercise per Week: 3 days    Minutes of Exercise per Session: 30 min  Stress: No Stress Concern Present (05/03/2024)   [employer] of Occupational Health - Occupational Stress Questionnaire    Feeling of Stress : Not at all  Social Connections: Socially Isolated (05/03/2024)    Social Connection and Isolation Panel [NHANES]    Frequency of Communication with Friends and Family: More than three times a week    Frequency of Social Gatherings with Friends and Family: Twice a week    Attends Religious Services: Never    Database administrator or Organizations: No    Attends Engineer, structural: Never    Marital Status: Divorced    Tobacco Counseling Ready to quit: Not Answered Counseling given: Not Answered Tobacco comments: 1 pack a day    Clinical Intake:  Pre-visit preparation completed: Yes  Pain : No/denies pain     BMI - recorded: 25.4 Nutritional Status: BMI 25 -29 Overweight Nutritional Risks: None Diabetes: No  What is the last grade level you completed in school?: 12  Interpreter Needed?: No      Activities of Daily Living  05/03/2024    8:06 AM 04/30/2024   10:54 AM  In your present state of health, do you have any difficulty performing the following activities:  Hearing? 0 0  Vision? 0 0  Difficulty concentrating or making decisions? 0 0  Walking or climbing stairs? 0 0  Dressing or bathing? 0 0  Doing errands, shopping? 0 0  Preparing Food and eating ? N N  Using the Toilet? N N  In the past six months, have you accidently leaked urine? N N  Do you have problems with loss of bowel control? N N  Managing your Medications? N N  Managing your Finances? N N  Housekeeping or managing your Housekeeping? N N    Patient Care Team: Cydney Draft, MD as PCP - General Audery Blazing Deannie Fabian, MD as PCP - Cardiology (Cardiology) Adelaide Adjutant, MD as Referring Physician (Specialist)  Indicate any recent Medical Services you may have received from other than [facility] providers in the past year (date may be approximate).     Assessment:   This is a routine wellness examination for Gerald Rogers.  Hearing/Vision screen No results found.   Goals Addressed             This Visit's Progress    Patient Stated        Patient stated he wants to increase his water intake.       Depression Screen    05/03/2024    8:12 AM 04/25/2024   10:34 AM 04/26/2023    8:06 AM 04/21/2023    9:31 AM 09/21/2022    9:14 AM 03/01/2022   10:35 AM 12/15/2021   11:09 AM  PHQ 2/9 Scores  PHQ - 2 Score 0 0 0 0 0 0 0  PHQ- 9 Score  0         Fall Risk    05/03/2024    8:13 AM 04/30/2024   10:54 AM 04/25/2024   10:34 AM 04/26/2023    8:05 AM 04/21/2023    9:33 AM  Fall Risk   Falls in the past year? 0 0 0 0 0  Number falls in past yr: 0  0 0 0  Injury with Fall? 0 0 0 0 0  Risk for fall due to : No Fall Risks  Impaired balance/gait Impaired mobility No Fall Risks  Follow up Falls evaluation completed  Falls evaluation completed Falls evaluation completed Falls evaluation completed    MEDICARE RISK AT HOME: Medicare Risk at Home Any stairs in or around the home?: No If so, are there any without handrails?: No Home free of loose throw rugs in walkways, pet beds, electrical cords, etc?: Yes Adequate lighting in your home to reduce risk of falls?: Yes Life alert?: No Use of a cane, walker or w/c?: Yes Grab bars in the bathroom?: Yes Shower chair or bench in shower?: Yes Elevated toilet seat or a handicapped toilet?: No  TIMED UP AND GO:  Was the test performed?  No    Cognitive Function:        05/03/2024    8:14 AM 04/26/2023    8:09 AM  6CIT Screen  What Year? 0 points 0 points  What month? 0 points 0 points  What time? 0 points 0 points  Count back from 20 0 points 0 points  Months in reverse 0 points 0 points  Repeat phrase 0 points 0 points  Total Score 0 points 0 points    Immunizations Immunization History  Administered  Date(s) Administered   Influenza, Seasonal, Injecte, Preservative Fre 10/27/2023   Influenza,inj,Quad PF,6+ Mos 01/03/2014, 11/13/2015, 02/15/2017, 08/18/2017, 08/24/2018, 08/27/2019, 08/28/2020, 08/31/2021, 09/21/2022   Influenza,inj,quad, With Preservative 09/04/2018    Influenza-Unspecified 09/19/2017   Moderna Sars-Covid-2 Vaccination 03/15/2020, 04/12/2020   PNEUMOCOCCAL CONJUGATE-20 04/21/2023   Tdap 04/23/2015    TDAP status: Up to date  Flu Vaccine status: Up to date  Pneumococcal vaccine status: Up to date  Covid-19 vaccine status: Declined, Education has been provided regarding the importance of this vaccine but patient still declined. Advised may receive this vaccine at local pharmacy or Health Dept.or vaccine clinic. Aware to provide a copy of the vaccination record if obtained from local pharmacy or Health Dept. Verbalized acceptance and understanding.  Qualifies for Shingles Vaccine? Yes   Zostavax completed No   Shingrix  Completed?: No.    Education has been provided regarding the importance of this vaccine. Patient has been advised to call insurance company to determine out of pocket expense if they have not yet received this vaccine. Advised may also receive vaccine at local pharmacy or Health Dept. Verbalized acceptance and understanding.  Screening Tests Health Maintenance  Topic Date Due   Lung Cancer Screening  05/13/2023   COVID-19 Vaccine (3 - Moderna risk series) 05/06/2024 (Originally 05/10/2020)   Zoster Vaccines- Shingrix  (1 of 2) 05/26/2024 (Originally 10/03/1978)   INFLUENZA VACCINE  07/13/2024   Fecal DNA (Cologuard)  03/15/2025   DTaP/Tdap/Td (2 - Td or Tdap) 04/22/2025   Medicare Annual Wellness (AWV)  05/03/2025   Pneumococcal Vaccine 71-55 Years old  Completed   Hepatitis C Screening  Completed   HIV Screening  Completed   HPV VACCINES  Aged Out   Meningococcal B Vaccine  Aged Out   Colonoscopy  Discontinued    Health Maintenance  Health Maintenance Due  Topic Date Due   Lung Cancer Screening  05/13/2023    Colorectal cancer screening: Type of screening: Colonoscopy. Completed 07/04/2015. Repeat every 10 years  Lung Cancer Screening: (Low Dose CT Chest recommended if Age 52-80 years, 20 pack-year currently  smoking OR have quit w/in 15years.) does qualify.   Lung Cancer Screening Referral: patient declined.   Additional Screening:  Hepatitis C Screening: does qualify; Completed 02/22/2019  Vision Screening: Recommended annual ophthalmology exams for early detection of glaucoma and other disorders of the eye. Is the patient up to date with their annual eye exam?  No  Who is the provider or what is the name of the office in which the patient attends annual eye exams?  If pt is not established with a provider, would they like to be referred to a provider to establish care? No .   Dental Screening: Recommended annual dental exams for proper oral hygiene   Community Resource Referral / Chronic Care Management: CRR required this visit?  No   CCM required this visit?  No     Plan:     I have personally reviewed and noted the following in the patient's chart:   Medical and social history Use of alcohol, tobacco or illicit drugs  Current medications and supplements including opioid prescriptions. Patient is not currently taking opioid prescriptions. Functional ability and status Nutritional status Physical activity Advanced directives List of other physicians Hospitalizations, surgeries, and ER visits in previous 12 months. None Vitals Screenings to include cognitive, depression, and falls Referrals and appointments  In addition, I have reviewed and discussed with patient certain preventive protocols, quality metrics, and best practice recommendations. A written personalized  care plan for preventive services as well as general preventive health recommendations were provided to patient.     Aubrey Leaf, CMA   05/03/2024   After Visit Summary: (Mail) Due to this being a telephonic visit, the after visit summary with patients personalized plan was offered to patient via mail   Nurse Notes:   Gerald Rogers is a 63 y.o. male patient of Metheney, Corita Diego, MD who had a  Medicare Annual Wellness Visit today via telephone. Gerald Rogers is Retired and lives with his sister. He has 1 child. He reports that he is socially active and does interact with friends/family regularly. He is minimally physically active and enjoys watching television and browsing on the computer.

## 2024-05-03 NOTE — Patient Instructions (Signed)
  Gerald Rogers , Thank you for taking time to come for your Medicare Wellness Visit. I appreciate your ongoing commitment to your health goals. Please review the following plan we discussed and let me know if I can assist you in the future.   These are the goals we discussed:  Goals       Patient Stated (pt-stated)      Patient stated that he would like to continue to maintain his current lifestyle.      Patient Stated      Patient stated he wants to increase his water intake.         This is a list of the screening recommended for you and due dates:  Health Maintenance  Topic Date Due   Screening for Lung Cancer  05/13/2023   COVID-19 Vaccine (3 - Moderna risk series) 05/06/2024*   Zoster (Shingles) Vaccine (1 of 2) 05/26/2024*   Flu Shot  07/13/2024   Cologuard (Stool DNA test)  03/15/2025   DTaP/Tdap/Td vaccine (2 - Td or Tdap) 04/22/2025   Medicare Annual Wellness Visit  05/03/2025   Pneumococcal Vaccination  Completed   Hepatitis C Screening  Completed   HIV Screening  Completed   HPV Vaccine  Aged Out   Meningitis B Vaccine  Aged Out   Colon Cancer Screening  Discontinued  *Topic was postponed. The date shown is not the original due date.

## 2024-05-09 DIAGNOSIS — J069 Acute upper respiratory infection, unspecified: Secondary | ICD-10-CM | POA: Diagnosis not present

## 2024-05-09 DIAGNOSIS — H1031 Unspecified acute conjunctivitis, right eye: Secondary | ICD-10-CM | POA: Diagnosis not present

## 2024-07-29 ENCOUNTER — Other Ambulatory Visit: Payer: Self-pay | Admitting: Family Medicine

## 2024-07-29 DIAGNOSIS — I10 Essential (primary) hypertension: Secondary | ICD-10-CM

## 2024-07-29 DIAGNOSIS — R5383 Other fatigue: Secondary | ICD-10-CM

## 2024-07-29 DIAGNOSIS — I2510 Atherosclerotic heart disease of native coronary artery without angina pectoris: Secondary | ICD-10-CM

## 2024-10-18 NOTE — Progress Notes (Signed)
 HPI: FU CAD.  Cardiac CTA May 2023 showed calcium  score 1015 which was 95th percentile, severe stenosis in the right coronary artery, severe stenosis in the proximal second diagonal and otherwise mild disease.  Cardiac catheterization June 2023 showed 99% proximal RCA, 80% mid RCA, normal LV function.  Attempt at PCI of the right coronary artery was unsuccessful.  He has been treated medically since that time.  I saw the patient in November 2023 and reviewed his films with Dr. Verlin.  He was not having chest pain and medical therapy again recommended.  Attempt at PCI of the RCA could be pursued if he develops chest pain.  Since last seen he has some dyspnea on exertion but no orthopnea, PND, pedal edema, chest pain or syncope.  Current Outpatient Medications  Medication Sig Dispense Refill   amLODipine  (NORVASC ) 10 MG tablet TAKE 1 TABLET BY MOUTH EVERY DAY 90 tablet 3   aspirin  EC 81 MG tablet Take 81 mg by mouth daily. Swallow whole.     atorvastatin  (LIPITOR ) 80 MG tablet Take 1 tablet (80 mg total) by mouth daily. 90 tablet 3   calcium  carbonate (TUMS - DOSED IN MG ELEMENTAL CALCIUM ) 500 MG chewable tablet Chew 500 mg by mouth at bedtime as needed for indigestion or heartburn.     losartan -hydrochlorothiazide  (HYZAAR) 100-12.5 MG tablet TAKE 1 TABLET BY MOUTH EVERY DAY 90 tablet 3   metoprolol  tartrate (LOPRESSOR ) 25 MG tablet TAKE 1/2 TABLET TWICE A DAY BY MOUTH 90 tablet 3   nitroGLYCERIN  (NITROSTAT ) 0.4 MG SL tablet Place 1 tablet (0.4 mg total) under the tongue every 5 (five) minutes as needed. 25 tablet 12   No current facility-administered medications for this visit.     Past Medical History:  Diagnosis Date   Anxiety    Arteriovenous fistula of spinal cord vessels    Arthritis    Headache    Hyperlipidemia    Hypertension     Past Surgical History:  Procedure Laterality Date   angiogram spinal     ANTERIOR CERVICAL DECOMP/DISCECTOMY FUSION N/A 03/23/2018    Procedure: ANTERIOR CERVICAL DECOMPRESSION/DISCECTOMY FUSION CERVICAL FOUR-FIVE, CERVICAL FIVE-SIX;  Surgeon: Burnetta Aures, MD;  Location: [facility] OR;  Service: Orthopedics;  Laterality: N/A;  3 hrs   CORONARY BALLOON ANGIOPLASTY N/A 05/27/2022   Procedure: CORONARY BALLOON ANGIOPLASTY;  Surgeon: Burnard Debby LABOR, MD;  Location: [facility] INVASIVE CV LAB;  Service: Cardiovascular;  Laterality: N/A;   EYE SURGERY     removal of foreign body   LEFT HEART CATH AND CORONARY ANGIOGRAPHY N/A 05/27/2022   Procedure: LEFT HEART CATH AND CORONARY ANGIOGRAPHY;  Surgeon: Burnard Debby LABOR, MD;  Location: [facility] INVASIVE CV LAB;  Service: Cardiovascular;  Laterality: N/A;   PERIPHERAL INTRAVASCULAR LITHOTRIPSY  05/27/2022   Procedure: INTRAVASCULAR LITHOTRIPSY;  Surgeon: Burnard Debby LABOR, MD;  Location: [facility] INVASIVE CV LAB;  Service: Cardiovascular;;   VAGAL NERVE STIMULATOR REMOVAL  09/2015   Dr. Carolee     Social History   Socioeconomic History   Marital status: Divorced    Spouse name: Not on file   Number of children: 1   Years of education: 12   Highest education level: GED or equivalent  Occupational History   Occupation: Disabled  Tobacco Use   Smoking status: Every Day    Current packs/day: 1.00    Average packs/day: 1 pack/day for 54.1 years (54.1 ttl pk-yrs)    Types: Cigarettes, E-cigarettes    Start date: 12/13/1970  Last attempt to quit: 02/11/2018   Smokeless tobacco: Current   Tobacco comments:    1 pack a day   Vaping Use   Vaping status: Former  Substance and Sexual Activity   Alcohol use: Yes    Comment: 6-10 beers once/week   Drug use: No   Sexual activity: Yes    Partners: Female  Other Topics Concern   Not on file  Social History Narrative   Lives with his sister. He has one child. He enjoys watching television and browsing on the computer.   Social Drivers of Corporate Investment Banker Strain: Low Risk  (05/03/2024)   Overall Financial Resource Strain (CARDIA)    Difficulty of  Paying Living Expenses: Not hard at all  Food Insecurity: No Food Insecurity (05/03/2024)   Hunger Vital Sign    Worried About Running Out of Food in the Last Year: Never true    Ran Out of Food in the Last Year: Never true  Transportation Needs: No Transportation Needs (05/03/2024)   PRAPARE - Administrator, Civil Service (Medical): No    Lack of Transportation (Non-Medical): No  Physical Activity: Insufficiently Active (05/03/2024)   Exercise Vital Sign    Days of Exercise per Week: 3 days    Minutes of Exercise per Session: 30 min  Stress: No Stress Concern Present (05/03/2024)   [employer] of Occupational Health - Occupational Stress Questionnaire    Feeling of Stress : Not at all  Social Connections: Socially Isolated (05/03/2024)   Social Connection and Isolation Panel    Frequency of Communication with Friends and Family: More than three times a week    Frequency of Social Gatherings with Friends and Family: Twice a week    Attends Religious Services: Never    Database Administrator or Organizations: No    Attends Banker Meetings: Never    Marital Status: Divorced  Catering Manager Violence: Not At Risk (05/03/2024)   Humiliation, Afraid, Rape, and Kick questionnaire    Fear of Current or Ex-Partner: No    Emotionally Abused: No    Physically Abused: No    Sexually Abused: No    Family History  Problem Relation Age of Onset   Heart attack Mother    Heart disease Mother 92   Hypertension Mother    Cancer Father    Stroke Maternal Aunt     ROS: Fatigue but no fevers or chills, productive cough, hemoptysis, dysphasia, odynophagia, melena, hematochezia, dysuria, hematuria, rash, seizure activity, orthopnea, PND, pedal edema, claudication. Remaining systems are negative.  Physical Exam: Well-developed well-nourished in no acute distress.  Skin is warm and dry.  HEENT is normal.  Neck is supple.  Chest is clear to auscultation with normal  expansion.  Cardiovascular exam is regular rate and rhythm.  Abdominal exam nontender or distended.  Positive bruit no masses palpated. Extremities show no edema. neuro grossly intact  EKG Interpretation Date/Time:  Wednesday October 24 2024 16:02:18 EST Ventricular Rate:  63 PR Interval:  180 QRS Duration:  84 QT Interval:  370 QTC Calculation: 378 R Axis:   70  Text Interpretation: Normal sinus rhythm Confirmed by Pietro Rogue ([id number]) on 10/24/2024 4:16:00 PM    A/P  1 coronary artery disease-patient denies chest pain.  Continue aspirin  and statin.  Continue beta-blocker.  We will plan to consider reattempt at PCI of the right coronary artery if he develops chest pain despite medical therapy.   2 hypertension-blood  pressure controlled.  Continue present medications.  Check potassium and renal function.   3 hyperlipidemia-continue statin.  Check lipids and liver.   4 tobacco abuse-patient again counseled on discontinuing.  5 bruit-schedule abdominal ultrasound to exclude aneurysm.  Redell Shallow, MD

## 2024-10-24 ENCOUNTER — Encounter: Payer: Self-pay | Admitting: Cardiology

## 2024-10-24 ENCOUNTER — Ambulatory Visit (INDEPENDENT_AMBULATORY_CARE_PROVIDER_SITE_OTHER): Admitting: Cardiology

## 2024-10-24 VITALS — BP 138/82 | HR 63 | Ht 64.0 in | Wt 150.0 lb

## 2024-10-24 DIAGNOSIS — E785 Hyperlipidemia, unspecified: Secondary | ICD-10-CM | POA: Diagnosis not present

## 2024-10-24 DIAGNOSIS — Z720 Tobacco use: Secondary | ICD-10-CM

## 2024-10-24 DIAGNOSIS — I10 Essential (primary) hypertension: Secondary | ICD-10-CM | POA: Diagnosis not present

## 2024-10-24 DIAGNOSIS — R0989 Other specified symptoms and signs involving the circulatory and respiratory systems: Secondary | ICD-10-CM

## 2024-10-24 DIAGNOSIS — I2510 Atherosclerotic heart disease of native coronary artery without angina pectoris: Secondary | ICD-10-CM | POA: Diagnosis not present

## 2024-10-24 DIAGNOSIS — Z136 Encounter for screening for cardiovascular disorders: Secondary | ICD-10-CM

## 2024-10-24 NOTE — Patient Instructions (Signed)
   Lab Work:  Your physician recommends that you return for lab work FASTING  If you have labs (blood work) drawn today and your tests are completely normal, you will receive your results only by: MyChart Message (if you have MyChart) OR A paper copy in the mail If you have any lab test that is abnormal or we need to change your treatment, we will call you to review the results.  Testing/Procedures:  Your physician has requested that you have an abdominal aorta duplex. During this test, an ultrasound is used to evaluate the aorta. Allow 30 minutes for this exam. Do not eat after midnight the day before and avoid carbonated beverages.  Please note: We ask at that you not bring children with you during ultrasound (echo/ vascular) testing. Due to room size and safety concerns, children are not allowed in the ultrasound rooms during exams. Our front office staff cannot provide observation of children in our lobby area while testing is being conducted. An adult accompanying a patient to their appointment will only be allowed in the ultrasound room at the discretion of the ultrasound technician under special circumstances. We apologize for any inconvenience. [facility]  Follow-Up: At [facility], you and your health needs are our priority.  As part of our continuing mission to provide you with exceptional heart care, our providers are all part of one team.  This team includes your primary Cardiologist (physician) and Advanced Practice Providers or APPs (Physician Assistants and Nurse Practitioners) who all work together to provide you with the care you need, when you need it.  Your next appointment:   12 month(s)  Provider:   Redell Shallow, MD

## 2024-10-25 ENCOUNTER — Other Ambulatory Visit: Payer: Self-pay | Admitting: Cardiology

## 2024-10-31 ENCOUNTER — Ambulatory Visit: Payer: Self-pay | Admitting: Cardiology

## 2024-10-31 ENCOUNTER — Ambulatory Visit (INDEPENDENT_AMBULATORY_CARE_PROVIDER_SITE_OTHER)

## 2024-10-31 ENCOUNTER — Ambulatory Visit (INDEPENDENT_AMBULATORY_CARE_PROVIDER_SITE_OTHER): Admitting: Family Medicine

## 2024-10-31 ENCOUNTER — Ambulatory Visit

## 2024-10-31 ENCOUNTER — Encounter: Payer: Self-pay | Admitting: Family Medicine

## 2024-10-31 VITALS — BP 114/66 | HR 62 | Ht 64.0 in | Wt 150.1 lb

## 2024-10-31 DIAGNOSIS — I10 Essential (primary) hypertension: Secondary | ICD-10-CM | POA: Diagnosis not present

## 2024-10-31 DIAGNOSIS — E7800 Pure hypercholesterolemia, unspecified: Secondary | ICD-10-CM

## 2024-10-31 DIAGNOSIS — I2510 Atherosclerotic heart disease of native coronary artery without angina pectoris: Secondary | ICD-10-CM

## 2024-10-31 DIAGNOSIS — Z136 Encounter for screening for cardiovascular disorders: Secondary | ICD-10-CM | POA: Diagnosis not present

## 2024-10-31 DIAGNOSIS — N509 Disorder of male genital organs, unspecified: Secondary | ICD-10-CM | POA: Diagnosis not present

## 2024-10-31 DIAGNOSIS — H02826 Cysts of left eye, unspecified eyelid: Secondary | ICD-10-CM

## 2024-10-31 DIAGNOSIS — Z125 Encounter for screening for malignant neoplasm of prostate: Secondary | ICD-10-CM

## 2024-10-31 DIAGNOSIS — R569 Unspecified convulsions: Secondary | ICD-10-CM

## 2024-10-31 DIAGNOSIS — F17200 Nicotine dependence, unspecified, uncomplicated: Secondary | ICD-10-CM

## 2024-10-31 DIAGNOSIS — H6121 Impacted cerumen, right ear: Secondary | ICD-10-CM

## 2024-10-31 DIAGNOSIS — Z23 Encounter for immunization: Secondary | ICD-10-CM | POA: Diagnosis not present

## 2024-10-31 DIAGNOSIS — R5383 Other fatigue: Secondary | ICD-10-CM

## 2024-10-31 MED ORDER — NITROGLYCERIN 0.4 MG SL SUBL: 0.4000 mg | SUBLINGUAL_TABLET | SUBLINGUAL | 12 refills | Status: AC | PRN

## 2024-10-31 NOTE — Assessment & Plan Note (Signed)
 Looks awesome, will get labs done today.

## 2024-10-31 NOTE — Assessment & Plan Note (Signed)
Stable. No recent exacerbations. 

## 2024-10-31 NOTE — Assessment & Plan Note (Signed)
 Continue daily statin and control BP.  Due for lipid panel today

## 2024-10-31 NOTE — Progress Notes (Signed)
 Established Patient Office Visit  Patient ID: Gerald Rogers, male    DOB: June 17, 1961  Age: 63 y.o. MRN: [id number] PCP: Alvan Dorothyann BIRCH, MD  Chief Complaint  Patient presents with   Hypertension    Subjective:     HPI  Discussed the use of AI scribe software for clinical note transcription with the patient, who gave verbal consent to proceed.  History of Present Illness Gerald Rogers is a 63 year old male who presents with fatigue and ear wax buildup.  Fatigue - Fatigue present for the past couple of months - Increased tiredness compared to baseline - Sleep duration approximately five hours per night - Daytime naps required - Fatigue worsens after strenuous activities, resulting in exhaustion for two days - No snoring - No history of low iron or vegetarian diet - Does not take vitamins - No shortness of breath  Eyelid lesion - Persistent lesion on eyelid for the past two months - Initially draining but not resolving - Daily hot compresses provide no improvement - History of conjunctivitis in March; used leftover drops without effect on current lesion  Cerumen impaction - Difficulty with ear wax buildup, especially in the right ear - Right ear remains full despite use of drops - Right ear feels 'wet in the morning' - Wax described as 'stuck to the skin'  Scrotal mass - Soft, non-painful lump above the right testicle discovered three to four weeks ago - No change in size - Located above the right  testicle at the start of the spermatic cord - Family history of cancer  Appt for aotic US  scheduled this AM.      ROS    Objective:     BP 114/66   Pulse 62   Ht 5' 4 (1.626 m)   Wt 150 lb 1.3 oz (68.1 kg)   SpO2 95%   BMI 25.76 kg/m    Physical Exam Vitals and nursing note reviewed.  Constitutional:      Appearance: Normal appearance.  HENT:     Head: Normocephalic and atraumatic.  Eyes:     Conjunctiva/sclera: Conjunctivae normal.   Cardiovascular:     Rate and Rhythm: Normal rate and regular rhythm.  Pulmonary:     Effort: Pulmonary effort is normal.     Breath sounds: Normal breath sounds.  Genitourinary:      Comments: Fullness near cord area.  Skin:    General: Skin is warm and dry.  Neurological:     Mental Status: He is alert.  Psychiatric:        Mood and Affect: Mood normal.      No results found for any visits on 10/31/24.    The 10-year ASCVD risk score (Arnett DK, et al., 2019) is: 15%    Assessment & Plan:   Problem List Items Addressed This Visit       Cardiovascular and Mediastinum   Coronary artery disease involving native heart without angina pectoris   Continue daily statin and control BP.  Due for lipid panel today       Relevant Medications   nitroGLYCERIN  (NITROSTAT ) 0.4 MG SL tablet   Other Relevant Orders   CMP14+EGFR   Lipid Panel With LDL/HDL Ratio   CBC with Differential   PSA   TSH   Iron, TIBC and Ferritin Panel   Benign essential hypertension - Primary   Looks awesome, will get labs done today.       Relevant Medications   nitroGLYCERIN  (  NITROSTAT ) 0.4 MG SL tablet   Other Relevant Orders   CMP14+EGFR   Lipid Panel With LDL/HDL Ratio   CBC with Differential   PSA   TSH   Iron, TIBC and Ferritin Panel     Other   Smoker   Relevant Orders   CMP14+EGFR   Lipid Panel With LDL/HDL Ratio   CBC with Differential   PSA   TSH   Iron, TIBC and Ferritin Panel   Other convulsions   Stable. No recent exacerbations      Hyperlipidemia   Relevant Medications   nitroGLYCERIN  (NITROSTAT ) 0.4 MG SL tablet   Other Relevant Orders   CMP14+EGFR   Lipid Panel With LDL/HDL Ratio   CBC with Differential   PSA   TSH   Iron, TIBC and Ferritin Panel   Other Visit Diagnoses       Other fatigue       Relevant Orders   CMP14+EGFR   Lipid Panel With LDL/HDL Ratio   CBC with Differential   PSA   TSH   Iron, TIBC and Ferritin Panel     Screening for  malignant neoplasm of prostate       Relevant Orders   CMP14+EGFR   Lipid Panel With LDL/HDL Ratio   CBC with Differential   PSA   TSH   Iron, TIBC and Ferritin Panel     Encounter for immunization       Relevant Orders   Flu vaccine HIGH DOSE PF(Fluzone Trivalent) (Completed)     Testicular abnormality       Relevant Orders   US  SCROTUM W/DOPPLER     Hearing loss of right ear due to cerumen impaction         Cyst, eyelid, left           Assessment and Plan Assessment & Plan Fatigue, persistent for several months Persistent fatigue with differential diagnosis of thyroid  dysfunction and iron deficiency. - Ordered TSH and iron panel.  Unspecified eyelid cyst, right side, persistent Persistent cyst likely due to blocked gland. - Continue warm compresses for one month. - Refer to ophthalmologist if cyst persists after three months.  Impacted cerumen, right ear Impacted cerumen. - Performed ear irrigation with peroxide and water.  Scrotal lump, right side, under evaluation Soft, non-tender lump with differential diagnosis of varicocele or testicular cancer. Family history of cancer noted. - Ordered scrotal ultrasound.  Atherosclerotic heart disease of native coronary artery without angina Atherosclerotic heart disease. - needs to stop smoking   Pure hypercholesterolemia Hypercholesterolemia.  General Health Maintenance Routine health maintenance with updated blood work and screenings. - Ordered CBC. - Ordered PSA test.  Ceruminosis is noted.  Wax is removed by syringing and manual debridement. Instructions for home care to prevent wax buildup are given.  Indication: Cerumen impaction of the ear(s) Medical necessity statement: On physical examination, cerumen impairs clinically significant portions of the external auditory canal, and tympanic membrane. Noted obstructive, copious cerumen that cannot be removed without magnification and instrumentations requiring  physician skills Consent: Discussed benefits and risks of procedure and verbal consent obtained Procedure: Patient was prepped for the procedure. Utilized an otoscope to assess and take note of the ear canal, the tympanic membrane, and the presence, amount, and placement of the cerumen. Gentle water irrigation and soft plastic curette was utilized to remove cerumen.  Post procedure examination: shows cerumen was completely removed. Patient tolerated procedure well. The patient is made aware that they may experience temporary vertigo, temporary  hearing loss, and temporary discomfort. If these symptom last for more than 24 hours to call the clinic or proceed to the ED.    Return in about 6 months (around 04/30/2025) for Hypertension.    Dorothyann Byars, MD [facility] at [facility]

## 2024-11-01 ENCOUNTER — Ambulatory Visit: Payer: Self-pay | Admitting: Family Medicine

## 2024-11-01 DIAGNOSIS — D72821 Monocytosis (symptomatic): Secondary | ICD-10-CM

## 2024-11-01 DIAGNOSIS — R5383 Other fatigue: Secondary | ICD-10-CM

## 2024-11-01 LAB — CBC WITH DIFFERENTIAL/PLATELET
Basophils Absolute: 0.1 x10E3/uL (ref 0.0–0.2)
Basos: 0 %
EOS (ABSOLUTE): 0.2 x10E3/uL (ref 0.0–0.4)
Eos: 1 %
Hematocrit: 48.9 % (ref 37.5–51.0)
Hemoglobin: 16.2 g/dL (ref 13.0–17.7)
Immature Grans (Abs): 0 x10E3/uL (ref 0.0–0.1)
Immature Granulocytes: 0 %
Lymphocytes Absolute: 2.3 x10E3/uL (ref 0.7–3.1)
Lymphs: 16 %
MCH: 31.8 pg (ref 26.6–33.0)
MCHC: 33.1 g/dL (ref 31.5–35.7)
MCV: 96 fL (ref 79–97)
Monocytes Absolute: 1.4 x10E3/uL — ABNORMAL HIGH (ref 0.1–0.9)
Monocytes: 10 %
Neutrophils Absolute: 10 x10E3/uL — ABNORMAL HIGH (ref 1.4–7.0)
Neutrophils: 73 %
Platelets: 272 x10E3/uL (ref 150–450)
RBC: 5.1 x10E6/uL (ref 4.14–5.80)
RDW: 13.8 % (ref 11.6–15.4)
WBC: 14.1 x10E3/uL — ABNORMAL HIGH (ref 3.4–10.8)

## 2024-11-01 LAB — CMP14+EGFR
ALT: 28 IU/L (ref 0–44)
AST: 19 IU/L (ref 0–40)
Albumin: 4.7 g/dL (ref 3.9–4.9)
Alkaline Phosphatase: 89 IU/L (ref 47–123)
BUN/Creatinine Ratio: 14 (ref 10–24)
BUN: 13 mg/dL (ref 8–27)
Bilirubin Total: 0.5 mg/dL (ref 0.0–1.2)
CO2: 26 mmol/L (ref 20–29)
Calcium: 10.8 mg/dL — ABNORMAL HIGH (ref 8.6–10.2)
Chloride: 92 mmol/L — ABNORMAL LOW (ref 96–106)
Creatinine, Ser: 0.9 mg/dL (ref 0.76–1.27)
Globulin, Total: 2.8 g/dL (ref 1.5–4.5)
Glucose: 91 mg/dL (ref 70–99)
Potassium: 4.4 mmol/L (ref 3.5–5.2)
Sodium: 133 mmol/L — ABNORMAL LOW (ref 134–144)
Total Protein: 7.5 g/dL (ref 6.0–8.5)
eGFR: 95 mL/min/1.73 (ref >59)

## 2024-11-01 LAB — LIPID PANEL WITH LDL/HDL RATIO
Cholesterol, Total: 121 mg/dL (ref 100–199)
HDL: 44 mg/dL (ref >39)
LDL Chol Calc (NIH): 55 mg/dL (ref 0–99)
LDL/HDL Ratio: 1.3 ratio (ref 0.0–3.6)
Triglycerides: 125 mg/dL (ref 0–149)
VLDL Cholesterol Cal: 22 mg/dL (ref 5–40)

## 2024-11-01 LAB — IRON,TIBC AND FERRITIN PANEL
Ferritin: 284 ng/mL (ref 30–400)
Iron Saturation: 22 % (ref 15–55)
Iron: 84 ug/dL (ref 38–169)
Total Iron Binding Capacity: 390 ug/dL (ref 250–450)
UIBC: 306 ug/dL (ref 111–343)

## 2024-11-01 LAB — PSA: Prostate Specific Ag, Serum: 0.8 ng/mL (ref 0.0–4.0)

## 2024-11-01 LAB — TSH: TSH: 4.37 u[IU]/mL (ref 0.450–4.500)

## 2024-11-01 NOTE — Progress Notes (Signed)
 Call patient: Sodium still a little low.  Just by 1 point but it has been about the same for a year so we will still just keep monitoring it at this point.  Calcium  level is elevated, but stable.  I know had mention to you that the white blood cells were slightly elevated about a year ago around 14,000 and they are actually the exact same this time so still slightly elevated.  If you get a chance I had like for you to come by the lab in a couple of weeks and I would like to check an extra blood test. Cholesterol looks good.  Prostate test is normal.  Thyroid  looks good.  Iron levels are adequate. You are a candidate for lung cancer screening if you would like to do it it is basically a low-dose CAT scan of your lungs so it is a little bit more fancy than a plain chest x-ray I am happy to set you up for that if that something you would like to do.  Orders Placed This Encounter  Procedures   Protein electrophoresis, serum   Sedimentation rate   C-reactive protein   CMV abs, IgG+IgM (cytomegalovirus)   EBV ab to viral capsid ag pnl, IgG+IgM

## 2024-11-06 NOTE — Progress Notes (Signed)
 Call patient: Ultrasound just showed what is called a hydrocele so this is little bit of fluid collection on that right side.  There is also actually a small left hydrocele as well.  The 1 on the right side is considered moderate.  But no sign of testicular mass or cancer.  A little bit of extra fluid in that area is not necessarily problematic.  But if you feel like you are having any discomfort or pain then we can refer you otherwise just continue to monitor.

## 2024-11-15 ENCOUNTER — Ambulatory Visit: Payer: Self-pay

## 2024-11-15 ENCOUNTER — Encounter: Payer: Self-pay | Admitting: Family Medicine

## 2024-11-15 LAB — CMV ABS, IGG+IGM (CYTOMEGALOVIRUS)
CMV Ab - IgG: <0.6 U/mL (ref 0.00–0.59)
CMV IgM Ser EIA-aCnc: <30 [AU]/ml (ref 0.0–29.9)

## 2024-11-15 LAB — PROTEIN ELECTROPHORESIS, SERUM
A/G Ratio: 1 (ref 0.7–1.7)
Albumin ELP: 3.5 g/dL (ref 2.9–4.4)
Alpha 1: 0.2 g/dL (ref 0.0–0.4)
Alpha 2: 1.1 g/dL — AB (ref 0.4–1.0)
Beta: 1.1 g/dL (ref 0.7–1.3)
Gamma Globulin: 1.1 g/dL (ref 0.4–1.8)
Globulin, Total: 3.5 g/dL (ref 2.2–3.9)
Total Protein: 7 g/dL (ref 6.0–8.5)

## 2024-11-15 LAB — SEDIMENTATION RATE: Sed Rate: 7 mm/h (ref 0–30)

## 2024-11-15 LAB — C-REACTIVE PROTEIN: CRP: 1 mg/L (ref 0–10)

## 2024-11-15 LAB — EBV AB TO VIRAL CAPSID AG PNL, IGG+IGM
EBV VCA IgG: >600 U/mL — ABNORMAL HIGH (ref 0.0–17.9)
EBV VCA IgM: <36 U/mL (ref 0.0–35.9)

## 2024-11-15 NOTE — Telephone Encounter (Signed)
 FYI Only or Action Required?: Action required by provider: lab or test result follow-up needed.  Patient was last seen in primary care on 10/31/2024 by Alvan Dorothyann BIRCH, MD.  Called Nurse Triage reporting Results.  Symptoms began No triage.  Interventions attempted: Other: No triage.  Symptoms are: stable.  Triage Disposition: Call PCP Within 24 Hours  Patient/caregiver understands and will follow disposition?: Yes  Copied from CRM #[id number]. Topic: Clinical - Lab/Test Results >> Nov 15, 2024 11:28 AM Emylou G wrote: Reason for CRM: need clarity on labs Reason for Disposition  Caller requesting lab results  (Exception: Routine or non-urgent lab result.)  Answer Assessment - Initial Assessment Questions Pt returning missed call from office about lab results. Relayed results as interpreted by PCP, pt with further questions. Called [facility] and transferred pt to Luke, CHARITY FUNDRAISER.   1. REASON FOR CALL or QUESTION: What is your reason for calling today? or How can I best     Clarification on recent lab results  2. CALLER: Document the source of call. (e.g., laboratory staff, caregiver or patient).     Pt  Protocols used: PCP Call - No Triage-A-AH

## 2024-11-15 NOTE — Progress Notes (Signed)
 Call patient: Negative for condition called multiple myeloma.  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  Could it means you have been exposed in the past and for some reason those antibodies are rubbed up right now.  It could be that maybe you have been exposed recently.  Inflammatory markers are normal no sign of autoimmune disease.  Negative for cytomegalovirus.

## 2024-11-15 NOTE — Telephone Encounter (Signed)
 Reviewed lab results with patient and answered patient's questions.

## 2024-11-22 ENCOUNTER — Encounter: Payer: Self-pay | Admitting: *Deleted

## 2025-04-30 ENCOUNTER — Ambulatory Visit: Admitting: Family Medicine

## 2025-05-07 ENCOUNTER — Ambulatory Visit
